# Patient Record
Sex: Female | Born: 1935 | Race: White | Hispanic: No | Marital: Married | Smoking: Never smoker
Health system: Southern US, Community
[De-identification: ages and names within clinical notes are randomized; demographics above are authoritative.]

## PROBLEM LIST (undated history)

## (undated) DIAGNOSIS — E785 Hyperlipidemia, unspecified: Secondary | ICD-10-CM

## (undated) DIAGNOSIS — E079 Disorder of thyroid, unspecified: Secondary | ICD-10-CM

## (undated) DIAGNOSIS — I10 Essential (primary) hypertension: Secondary | ICD-10-CM

## (undated) DIAGNOSIS — E119 Type 2 diabetes mellitus without complications: Secondary | ICD-10-CM

## (undated) HISTORY — DX: Disorder of thyroid, unspecified: E07.9

## (undated) HISTORY — DX: Hyperlipidemia, unspecified: E78.5

## (undated) HISTORY — DX: Type 2 diabetes mellitus without complications: E11.9

## (undated) HISTORY — DX: Essential (primary) hypertension: I10

---

## 2015-07-16 LAB — LIPID PANEL
Cholesterol: 222 mg/dL — AB (ref 0–200)
HDL: 70 mg/dL (ref 35–70)
LDL CALC: 106 mg/dL
TRIGLYCERIDES: 229 mg/dL — AB (ref 40–160)

## 2015-07-16 LAB — MICROALBUMIN, URINE: MICROALB UR: 14

## 2015-07-16 LAB — CBC AND DIFFERENTIAL
HCT: 36 % (ref 36–46)
HEMOGLOBIN: 12.2 g/dL (ref 12.0–16.0)
Platelets: 260 10*3/uL (ref 150–399)
WBC: 7.9 10*3/mL

## 2015-07-16 LAB — HEPATIC FUNCTION PANEL
ALT: 25 U/L (ref 7–35)
AST: 18 U/L (ref 13–35)
Alkaline Phosphatase: 63 U/L (ref 25–125)
BILIRUBIN, TOTAL: 0.4 mg/dL

## 2015-07-16 LAB — BASIC METABOLIC PANEL
Creatinine: 0.9 mg/dL (ref 0.5–1.1)
Potassium: 4.2 mmol/L (ref 3.4–5.3)
SODIUM: 134 mmol/L — AB (ref 137–147)

## 2015-07-16 LAB — MICROALBUMIN / CREATININE URINE RATIO: MICROALB/CREAT RATIO: 14.9

## 2015-07-16 LAB — HEMOGLOBIN A1C: HEMOGLOBIN A1C: 6.8

## 2015-07-16 LAB — TSH+FREE T4: TSH: 87.97

## 2015-07-16 LAB — TSH: TSH: 0.08 u[IU]/mL — AB (ref <=5.90)

## 2015-07-17 ENCOUNTER — Encounter: Payer: Self-pay | Admitting: Family Medicine

## 2015-07-17 ENCOUNTER — Ambulatory Visit (INDEPENDENT_AMBULATORY_CARE_PROVIDER_SITE_OTHER): Payer: Medicare Other | Admitting: Family Medicine

## 2015-07-17 VITALS — BP 113/61 | HR 96 | Ht 68.5 in | Wt 146.0 lb

## 2015-07-17 DIAGNOSIS — E039 Hypothyroidism, unspecified: Secondary | ICD-10-CM | POA: Diagnosis not present

## 2015-07-17 DIAGNOSIS — H34239 Retinal artery branch occlusion, unspecified eye: Secondary | ICD-10-CM

## 2015-07-17 DIAGNOSIS — R413 Other amnesia: Secondary | ICD-10-CM

## 2015-07-17 DIAGNOSIS — E119 Type 2 diabetes mellitus without complications: Secondary | ICD-10-CM

## 2015-07-17 DIAGNOSIS — M1990 Unspecified osteoarthritis, unspecified site: Secondary | ICD-10-CM

## 2015-07-17 DIAGNOSIS — I10 Essential (primary) hypertension: Secondary | ICD-10-CM

## 2015-07-17 DIAGNOSIS — M810 Age-related osteoporosis without current pathological fracture: Secondary | ICD-10-CM

## 2015-07-17 LAB — BASIC METABOLIC PANEL
BUN: 13 mg/dL (ref 7–25)
CO2: 27 mmol/L (ref 20–31)
Calcium: 9.1 mg/dL (ref 8.6–10.4)
Chloride: 104 mmol/L (ref 98–110)
Creat: 0.8 mg/dL (ref 0.60–0.93)
Glucose, Bld: 138 mg/dL — ABNORMAL HIGH (ref 65–99)
POTASSIUM: 4.3 mmol/L (ref 3.5–5.3)
SODIUM: 138 mmol/L (ref 135–146)

## 2015-07-17 LAB — HEMOGLOBIN A1C
HEMOGLOBIN A1C: 7.6 % — AB (ref <5.7)
MEAN PLASMA GLUCOSE: 171 mg/dL — AB (ref <117)

## 2015-07-17 LAB — TSH: TSH: 0.019 u[IU]/mL — ABNORMAL LOW (ref 0.350–4.500)

## 2015-07-17 MED ORDER — DONEPEZIL HCL 10 MG PO TABS: 10.0000 mg | ORAL_TABLET | Freq: Every day | ORAL | Status: DC

## 2015-07-17 MED ORDER — ONDANSETRON HCL 4 MG PO TABS: 4.0000 mg | ORAL_TABLET | Freq: Three times a day (TID) | ORAL | Status: DC | PRN

## 2015-07-17 MED ORDER — CELECOXIB 200 MG PO CAPS: 200.0000 mg | ORAL_CAPSULE | Freq: Two times a day (BID) | ORAL | Status: DC | PRN

## 2015-07-17 NOTE — Progress Notes (Signed)
CC: Tasha RowanMary Sheppard is a 80 y.o. female is here for Establish Care   Subjective: HPI:   very pleasant 80 year old here to establish care accompanied by daughter, Tasha OppenheimKitty   History of type 2 diabetes currently taking 10 units of Lantus on a daily basis. Blood sugars are ranging in the low to high 100s. She was once on Januvia and  A sulfonylurea however was having  Hypoglycemic episodes and these were stopped. Denies any motor or sensory disturbances in the appendages   She is a history of osteoporosis currently  fosamax which she has been on for 2 years now. She is unsure of her  Last bone density scan.   She claims osteoarthritis in the elbows and in the hands. This has been present for matter of years. She's currently using Aleve and topical over-the-counter preparations. She felt that symptoms are only mildly helped. Symptoms are interfering with her quality of life still despite taking these medications. She denies any swelling or redness of joints.   History essential hypertension currently on lisinopril. No outside blood pressures report.   She has a history of a retinal artery occlusion and [facility], she and her daughter are [facility]   Her major problem today is memory loss, this  Has been present for at least 3 years and is slowly worsening. It was once thought that this was due to uncontrolled hypothyroidism however her TSH was normal when checked back in the summer and symptoms have not improved. She does not recognize that the family members pointed out forgetfulness, short-term memory loss and once walking out of a store with unpurchased items by accident.  Review of Systems - General ROS: negative for - chills, fever, night sweats, weight gain or weight loss Ophthalmic ROS: negative for - decreased vision Psychological ROS: negative for - anxiety or depression ENT ROS: negative for - hearing change, nasal congestion,  tinnitus or allergies Hematological and Lymphatic ROS: negative for - bleeding problems, bruising or swollen lymph nodes Breast ROS: negative Respiratory ROS: no cough, shortness of breath, or wheezing Cardiovascular ROS: no chest pain or dyspnea on exertion Gastrointestinal ROS: no abdominal pain, change in bowel habits, or black or bloody stools Genito-Urinary ROS: negative for - genital discharge, genital ulcers, incontinence or abnormal bleeding from genitals Musculoskeletal ROS: negative for - joint pain or muscle painother than that described above Neurological ROS: negative for - headaches Dermatological ROS: negative for lumps, mole changes, rash and skin lesion changes  Past Medical History  Diagnosis Date  . Hypertension   . Diabetes mellitus without complication (HCC)   . Thyroid disease   . Hyperlipidemia     History reviewed. No pertinent past surgical history. Family History  Problem Relation Age of Onset  . Diabetes Mother   . Stroke Mother   . Diabetes Brother   . Stroke Brother     Social History   Social History  . Marital Status: Married    Spouse Name: N/A  . Number of Children: N/A  . Years of Education: N/A   Occupational History  . Not on file.   Social History Main Topics  . Smoking status: Never Smoker   . Smokeless tobacco: Never Used  . Alcohol Use: No  . Drug Use: No  . Sexual Activity: Not on file   Other Topics Concern  . Not on file   Social History Narrative  . No narrative on file  Objective: BP 113/61 mmHg  Pulse 96  Ht 5' 8.5" (1.74 m)  Wt 146 lb (66.225 kg)  BMI 21.87 kg/m2  Vital signs reviewed. General: Alert and Oriented, No Acute Distress HEENT: Pupils equal, round, reactive to light. Conjunctivae clear.  External ears unremarkable.  Moist mucous membranes. Lungs: Clear and comfortable work of breathing, speaking in full sentences without accessory muscle use. Cardiac: Regular rate and rhythm.  Neuro: CN II-XII  grossly intact, gait normal. Extremities: No peripheral edema.  Strong peripheral pulses.  Mental Status: No depression, anxiety, nor agitation. Logical though process. Skin: Warm and dry.  Assessment & Plan: Tasha Sheppard was seen today for establish care.  Diagnoses and all orders for this visit:  Type 2 diabetes mellitus without complication, without long-term current use of insulin (HCC) -     Hemoglobin A1c  Osteoporosis  Hypothyroidism, unspecified hypothyroidism type -     TSH  Essential hypertension, benign -     Basic Metabolic Panel (BMET)  Memory loss  Branch retinal artery occlusion, unspecified laterality -     Ambulatory referral to Ophthalmology  Osteoarthritis, unspecified osteoarthritis type, unspecified site  Other orders -     donepezil (ARICEPT) 10 MG tablet; Take 1 tablet (10 mg total) by mouth at bedtime. To help with memory. -     ondansetron (ZOFRAN) 4 MG tablet; Take 1-2 tablets (4-8 mg total) by mouth every 8 (eight) hours as needed for nausea or vomiting. -     celecoxib (CELEBREX) 200 MG capsule; Take 1 capsule (200 mg total) by mouth 2 (two) times daily as needed (pain).   Type 2 diabetes: Continue Lantus 10 units daily pending A1c Osteoporosis: Requesting outside records, continue Fosamax Hypothyroidism: Continue levothyroxine pending TSH today Essential hypertension: Controlled continue lisinopril pending renal function checked today Memory loss: Start Aricept Retinal artery occlusion, [facility] referral per request Osteoarthritis: Start celecoxib in the place of Aleve  Return in about 3 months (around 10/16/2015) for Memory and Sugar Follow Up.

## 2015-07-18 ENCOUNTER — Telehealth: Payer: Self-pay | Admitting: Family Medicine

## 2015-07-18 MED ORDER — LEVOTHYROXINE SODIUM 100 MCG PO TABS: 100.0000 ug | ORAL_TABLET | Freq: Every day | ORAL | Status: DC

## 2015-07-18 NOTE — Telephone Encounter (Signed)
Will you please let patient or family member know that her levothyroxine dose appears to be too high therefore I've sent in a new formulation of 100 mcg to her [facility].  We'll want to recheck this in three months.  Her A1c was 7.6 and at goal since it was below 8.

## 2015-07-18 NOTE — Telephone Encounter (Signed)
Pt advised.

## 2015-07-25 ENCOUNTER — Ambulatory Visit (INDEPENDENT_AMBULATORY_CARE_PROVIDER_SITE_OTHER): Payer: Medicare Other | Admitting: Family Medicine

## 2015-07-25 ENCOUNTER — Encounter: Payer: Self-pay | Admitting: Family Medicine

## 2015-07-25 VITALS — BP 102/59 | HR 92 | Temp 98.1°F | Wt 145.0 lb

## 2015-07-25 DIAGNOSIS — R110 Nausea: Secondary | ICD-10-CM

## 2015-07-25 DIAGNOSIS — R7309 Other abnormal glucose: Secondary | ICD-10-CM | POA: Diagnosis not present

## 2015-07-25 DIAGNOSIS — R739 Hyperglycemia, unspecified: Secondary | ICD-10-CM

## 2015-07-25 LAB — GLUCOSE, POCT (MANUAL RESULT ENTRY): POC GLUCOSE: 241 mg/dL — AB (ref 70–99)

## 2015-07-25 MED ORDER — METOCLOPRAMIDE HCL 10 MG PO TABS: 10.0000 mg | ORAL_TABLET | Freq: Three times a day (TID) | ORAL | Status: DC | PRN

## 2015-07-25 NOTE — Progress Notes (Signed)
CC: Hassan RowanMary Hilburn is a 80 y.o. female is here for Nausea and Blood Sugar Problem   Subjective: HPI:  Patient complains of nauseousness that happens on a daily basis that has been present for matter of years. Lately she's been handling this with zofran and drinking ginger ale during the day. Family noticed that her blood sugar was 300 today after drinking ginger ale which alarmed them to come to my office. Patient denies any abdominal pain or reflux-like symptoms. Nothing seems to make symptoms better or worse. It's caused her to lose her appetite. Family notices that if they go out to eat she seems hungry but at home she has no appetite. She denies fevers, chills, nor new back pain.denies unintentional weight loss.   Review Of Systems Outlined In HPI  Past Medical History  Diagnosis Date  . Hypertension   . Diabetes mellitus without complication (HCC)   . Thyroid disease   . Hyperlipidemia     No past surgical history on file. Family History  Problem Relation Age of Onset  . Diabetes Mother   . Stroke Mother   . Diabetes Brother   . Stroke Brother     Social History   Social History  . Marital Status: Married    Spouse Name: N/A  . Number of Children: N/A  . Years of Education: N/A   Occupational History  . Not on file.   Social History Main Topics  . Smoking status: Never Smoker   . Smokeless tobacco: Never Used  . Alcohol Use: No  . Drug Use: No  . Sexual Activity: Not on file   Other Topics Concern  . Not on file   Social History Narrative     Objective: BP 102/59 mmHg  Pulse 92  Temp(Src) 98.1 F (36.7 C) (Oral)  Wt 145 lb (65.772 kg)  General: Alert and Oriented, No Acute Distress HEENT: Pupils equal, round, reactive to light. Conjunctivae clear.  Moist mucous membranes Lungs: Clear to auscultation bilaterally, no wheezing/ronchi/rales.  Comfortable work of breathing. Good air movement. Cardiac: Regular rate and rhythm. Normal S1/S2.  No murmurs, rubs,  nor gallops.   Abdomen: Normal bowel sounds, soft and non tender without palpable masses.no guarding or rebound tenderness Extremities: No peripheral edema.  Strong peripheral pulses.  Mental Status: No depression, anxiety, nor agitation. Skin: Warm and dry.  Blood sugar today 240  Assessment & Plan: [name] was seen today for nausea and blood sugar problem.  Diagnoses and all orders for this visit:  Nausea without vomiting -     Lipase -     metoCLOPramide (REGLAN) 10 MG tablet; Take 1 tablet (10 mg total) by mouth every 8 (eight) hours as needed for nausea.  Elevated blood sugar   Nausea: Rule out mild pancreatic Titus with lipase today, she can start Reglan instead of taking Zofran. If her appetite continues to be a problem family has inquired whether or not she could get Marinol which seems reasonable. Signs and symptoms requring emergent/urgent reevaluation were discussed with the patient. LAD the LAD 25 minutes spent face-to-face during visit today of which at least 50% was counseling or coordinating care regarding: 1. Nausea without vomiting   2. Elevated blood sugar        Return in about 3 months (around 10/23/2015).

## 2015-07-26 ENCOUNTER — Telehealth: Payer: Self-pay | Admitting: Family Medicine

## 2015-07-26 DIAGNOSIS — R110 Nausea: Secondary | ICD-10-CM

## 2015-07-26 DIAGNOSIS — R748 Abnormal levels of other serum enzymes: Secondary | ICD-10-CM

## 2015-07-26 LAB — LIPASE: LIPASE: 153 U/L — AB (ref 7–60)

## 2015-07-26 NOTE — Telephone Encounter (Signed)
Husband notified  

## 2015-07-26 NOTE — Telephone Encounter (Signed)
Will you please let patient's family member (daughter or husband) know that Tasha Sheppard's blood test showed signs of mild liver inflammation that could be the cause of her nausea.  I'd recommend having an ultrasound of her abdomen performed to look for the ultimate cause of this.  Our radiology office should be calling her today or tomorrow, please let me know if not contacted by Monday.

## 2015-07-27 ENCOUNTER — Other Ambulatory Visit: Payer: Self-pay

## 2015-07-27 ENCOUNTER — Ambulatory Visit (INDEPENDENT_AMBULATORY_CARE_PROVIDER_SITE_OTHER): Payer: Medicare Other

## 2015-07-27 DIAGNOSIS — R748 Abnormal levels of other serum enzymes: Secondary | ICD-10-CM | POA: Diagnosis not present

## 2015-07-27 DIAGNOSIS — R110 Nausea: Secondary | ICD-10-CM

## 2015-07-27 MED ORDER — CITALOPRAM HYDROBROMIDE 20 MG PO TABS: 20.0000 mg | ORAL_TABLET | Freq: Every day | ORAL | Status: DC

## 2015-07-30 ENCOUNTER — Ambulatory Visit (HOSPITAL_BASED_OUTPATIENT_CLINIC_OR_DEPARTMENT_OTHER)
Admission: RE | Admit: 2015-07-30 | Discharge: 2015-07-30 | Disposition: A | Discharge status: Home / Self Care | Payer: Medicare Other | Source: Ambulatory Visit | Attending: Family Medicine | Admitting: Family Medicine

## 2015-07-30 ENCOUNTER — Telehealth: Payer: Self-pay | Admitting: Family Medicine

## 2015-07-30 DIAGNOSIS — I708 Atherosclerosis of other arteries: Secondary | ICD-10-CM | POA: Insufficient documentation

## 2015-07-30 DIAGNOSIS — R110 Nausea: Secondary | ICD-10-CM | POA: Diagnosis not present

## 2015-07-30 DIAGNOSIS — R748 Abnormal levels of other serum enzymes: Secondary | ICD-10-CM

## 2015-07-30 DIAGNOSIS — I700 Atherosclerosis of aorta: Secondary | ICD-10-CM | POA: Insufficient documentation

## 2015-07-30 DIAGNOSIS — R531 Weakness: Secondary | ICD-10-CM | POA: Insufficient documentation

## 2015-07-30 MED ORDER — IOHEXOL 300 MG/ML  SOLN
100.0000 mL | Freq: Once | INTRAMUSCULAR | Status: AC | PRN
  Administered 2015-07-30: 100 mL via INTRAVENOUS

## 2015-07-30 NOTE — Telephone Encounter (Signed)
Husband notified  

## 2015-07-30 NOTE — Telephone Encounter (Signed)
Will you please let patient know that her ultrasound was normal but was not able to fully visualize her pancreas.  I'd recommend having a CT scan done to fully look at her pancreas.  An order has been placed, please let me know if not not contacted about scheduling by the end of the week.

## 2015-07-31 ENCOUNTER — Telehealth: Payer: Self-pay | Admitting: Family Medicine

## 2015-07-31 DIAGNOSIS — R748 Abnormal levels of other serum enzymes: Secondary | ICD-10-CM

## 2015-07-31 NOTE — Telephone Encounter (Signed)
In regards to CT result note I'd recommend repeating the lipase level to see if it's staying the same, worsening, or improving.  Lab slip in your in box.

## 2015-07-31 NOTE — Telephone Encounter (Signed)
Husband advised. 

## 2015-08-01 ENCOUNTER — Encounter: Payer: Self-pay | Admitting: *Deleted

## 2015-08-01 NOTE — Progress Notes (Unsigned)
   Subjective:    Patient ID: Tasha Sheppard, female    DOB: 04/06/1935, 80 y.o.   MRN: [id number]  HPI  Patient comes in with husband, complaints of nausea and no appetite  Review of Systems     Objective:   Physical Exam Patient shows no signs of distress       Assessment & Plan:  Performed vitals and spoke with patient about symptoms.  Hasn't had an appetite in a while and is nauseous but without vomiting.  Her husband is very concerned that she isn't eating or drinking anything but diet ginger ale.  I informed Dr. Ivan AnchorsHommel and we are waiting for some blood work to come back and then proceed with a plan for this patient.

## 2015-08-02 ENCOUNTER — Telehealth: Payer: Self-pay | Admitting: Gastroenterology

## 2015-08-02 ENCOUNTER — Telehealth: Payer: Self-pay | Admitting: Family Medicine

## 2015-08-02 DIAGNOSIS — R110 Nausea: Secondary | ICD-10-CM

## 2015-08-02 DIAGNOSIS — R748 Abnormal levels of other serum enzymes: Secondary | ICD-10-CM

## 2015-08-02 LAB — LIPASE: Lipase: 49 U/L (ref 7–60)

## 2015-08-02 MED ORDER — MIRTAZAPINE 15 MG PO TABS: 15.0000 mg | ORAL_TABLET | Freq: Every day | ORAL | Status: DC

## 2015-08-02 NOTE — Telephone Encounter (Signed)
Husband notified.  The GI office contacted the pt before I could call them back and they're offering Tasha Sheppard an appointment in early Feb.

## 2015-08-02 NOTE — Telephone Encounter (Signed)
Daughter notified 

## 2015-08-02 NOTE — Telephone Encounter (Signed)
Since that's so far off I'll send in a Rx for mirtazapine in hopes that this stimulates her appetite.

## 2015-08-02 NOTE — Telephone Encounter (Signed)
Ok to schedule appt in Feb and can try to bring in sooner in case of any cancellations or availability on the schedule

## 2015-08-02 NOTE — Telephone Encounter (Signed)
Will you please let patient's family know that her pancreas test is now back in the normal range.  Since she's still having a lack of appetite and nausea I'll refer her to a gastroenterologist for further workup since her CT and Ultrasound were normal.

## 2015-08-02 NOTE — Telephone Encounter (Signed)
Normal Lipase, abdominal u/s and CT of the abdomen. Her PCP is aware of the first available appointment being in February. No PA openings. PCP prescribed Remeron to stimulate her appetite. Please advise as Doc of the Day on the appointment.

## 2015-08-02 NOTE — Telephone Encounter (Signed)
Wife is still not feeling well.

## 2015-08-02 NOTE — Telephone Encounter (Signed)
See Dr Elana AlmNandigam's note.

## 2015-08-02 NOTE — Telephone Encounter (Signed)
Yes this is why I'm referring her to a gastroenterologist.

## 2015-08-02 NOTE — Telephone Encounter (Signed)
Called and spoke w/Patient.  Patient stated she would contact her PCP

## 2015-08-08 ENCOUNTER — Encounter: Payer: Self-pay | Admitting: Family Medicine

## 2015-08-08 ENCOUNTER — Ambulatory Visit (INDEPENDENT_AMBULATORY_CARE_PROVIDER_SITE_OTHER): Payer: Medicare Other | Admitting: Family Medicine

## 2015-08-08 ENCOUNTER — Ambulatory Visit (INDEPENDENT_AMBULATORY_CARE_PROVIDER_SITE_OTHER): Payer: Medicare Other

## 2015-08-08 VITALS — BP 119/63 | HR 88 | Temp 97.9°F | Resp 18 | Wt 145.4 lb

## 2015-08-08 DIAGNOSIS — J189 Pneumonia, unspecified organism: Secondary | ICD-10-CM

## 2015-08-08 DIAGNOSIS — M542 Cervicalgia: Secondary | ICD-10-CM

## 2015-08-08 DIAGNOSIS — M4802 Spinal stenosis, cervical region: Secondary | ICD-10-CM

## 2015-08-08 NOTE — Progress Notes (Signed)
CC: Tasha Sheppard is a 80 y.o. female is here for Hospitalization Follow-up   Subjective: HPI:  Earlier this week she had a episode where she passed out at the dinner table. She regained consciousness within 15 seconds. EMS took her to a local hospital and she was found to have a right upper lobe pneumonia along with signs of orthostatic hypotension. She was given Rocephin and observed overnight. During that time she also had a CT scan of the face, MRI of the brain, carotid ultrasound and echocardiogram. There were no abnormalities seen that would suggest a reason for her passing out. It was felt that this was likely due to hypovolemia in the setting of pneumonia. She was having an occasional cough but this is resolved. She is tolerating Levaquin and has 5 more days left of a seven-day course. She denies fevers, chills, decreased appetite or abdominal pain.  Ever since stopping Aricept she's noticed that her nausea has returned to its baseline. 2 days after starting Aricept she was getting nauseousthat would not respond to Zofran or Reglan.  She is also worried about chronic neck pain has been present for matter of years. She cannot remember what interventions have been done as yet but she does not recall ever having a x-ray of the neck. She states is midline neck pain on a daily basis that is not radiating. Nothing seems to make it better or worse. It is mild in severity.   Review Of Systems Outlined In HPI  Past Medical History  Diagnosis Date  . Hypertension   . Diabetes mellitus without complication (HCC)   . Thyroid disease   . Hyperlipidemia     No past surgical history on file. Family History  Problem Relation Age of Onset  . Diabetes Mother   . Stroke Mother   . Diabetes Brother   . Stroke Brother     Social History   Social History  . Marital Status: Married    Spouse Name: N/A  . Number of Children: N/A  . Years of Education: N/A   Occupational History  . Not on file.    Social History Main Topics  . Smoking status: Never Smoker   . Smokeless tobacco: Never Used  . Alcohol Use: No  . Drug Use: No  . Sexual Activity: Not on file   Other Topics Concern  . Not on file   Social History Narrative     Objective: BP 119/63 mmHg  Pulse 88  Temp(Src) 97.9 F (36.6 C)  Resp 18  Wt 145 lb 6.4 oz (65.953 kg)  SpO2 97%  General: Alert and Oriented, No Acute Distress HEENT: Pupils equal, round, reactive to light. Conjunctivae clear.  Moist mucous membranes Lungs: Clear to auscultation bilaterally, no wheezing/ronchi/rales.  Comfortable work of breathing. Good air movement. Cardiac: Regular rate and rhythm. Normal S1/S2.  No murmurs, rubs, nor gallops.   Back: No midline spinous process tenderness in the cervical spine. Cervical exam shows Full range of motion and strength in all 3 planes of motion. Extremities: No peripheral edema.  Strong peripheral pulses.  Mental Status: No depression, anxiety, nor agitation. Skin: Warm and dry.  Assessment & Plan: Tahni was seen today for hospitalization follow-up.  Diagnoses and all orders for this visit:  CAP (community acquired pneumonia) -     DG Chest 2 View; Future  Posterior neck pain -     DG Cervical Spine Complete; Future   Community acquired pneumonia: Agree with Levaquin regimen, continue the  final 5 days. Obtain x-ray to see if resolution has begun. Posterior neck pain: Obtain spine films for further evaluation. Stopping Aricept due to possibility that this is causing worsened nausea.  25 minutes spent face-to-face during visit today of which at least 50% was counseling or coordinating care regarding: 1. CAP (community acquired pneumonia)   2. Posterior neck pain       Return if symptoms worsen or fail to improve.

## 2015-08-09 ENCOUNTER — Telehealth: Payer: Self-pay | Admitting: Family Medicine

## 2015-08-09 DIAGNOSIS — M5030 Other cervical disc degeneration, unspecified cervical region: Secondary | ICD-10-CM | POA: Insufficient documentation

## 2015-08-09 MED ORDER — MELOXICAM 15 MG PO TABS: 15.0000 mg | ORAL_TABLET | Freq: Every day | ORAL | Status: DC

## 2015-08-09 NOTE — Telephone Encounter (Signed)
Will you please let patient or husband know that her pneumonia is improving based on her chest xray.  Also her neck has a moderate degree of arthritis and I'd recommend trying an anti-inflammatory called meloxicam, i'll send this to her pharmacy.  If this does not help pain please call and let me know so I can refer her to one of our sports medicine doctors for further evaluation.

## 2015-08-09 NOTE — Telephone Encounter (Signed)
Awaiting call back.

## 2015-08-09 NOTE — Telephone Encounter (Signed)
Daughter advised.

## 2015-08-22 ENCOUNTER — Ambulatory Visit (INDEPENDENT_AMBULATORY_CARE_PROVIDER_SITE_OTHER): Payer: Medicare Other | Admitting: Family Medicine

## 2015-08-22 ENCOUNTER — Encounter: Payer: Self-pay | Admitting: Family Medicine

## 2015-08-22 ENCOUNTER — Ambulatory Visit (INDEPENDENT_AMBULATORY_CARE_PROVIDER_SITE_OTHER): Payer: Medicare Other

## 2015-08-22 VITALS — BP 121/69 | HR 109 | Wt 141.0 lb

## 2015-08-22 DIAGNOSIS — Z1231 Encounter for screening mammogram for malignant neoplasm of breast: Secondary | ICD-10-CM

## 2015-08-22 DIAGNOSIS — M25511 Pain in right shoulder: Secondary | ICD-10-CM

## 2015-08-22 DIAGNOSIS — M810 Age-related osteoporosis without current pathological fracture: Secondary | ICD-10-CM | POA: Diagnosis not present

## 2015-08-22 DIAGNOSIS — Z1239 Encounter for other screening for malignant neoplasm of breast: Secondary | ICD-10-CM

## 2015-08-22 DIAGNOSIS — F0390 Unspecified dementia without behavioral disturbance: Secondary | ICD-10-CM | POA: Diagnosis not present

## 2015-08-22 MED ORDER — MEMANTINE HCL 5 MG PO TABS: 5.0000 mg | ORAL_TABLET | Freq: Two times a day (BID) | ORAL | Status: DC

## 2015-08-22 MED ORDER — ALENDRONATE SODIUM 70 MG PO TABS: 70.0000 mg | ORAL_TABLET | ORAL | Status: DC

## 2015-08-22 NOTE — Progress Notes (Signed)
CC: Tasha Sheppard is a 81 y.o. female is here for Fall   Subjective: HPI:  Accompanied by son in law, Tasha Sheppard, and daughter, Tasha Sheppard  Tuesday of last week she had an unwitnessed fall at [employer]. She was found to have  A collection of mildly displaced  Facial fractures that was evaluated by [facility] thankfully feels that conservative therapy is warranted. Patient does not recall the fall other than being on the ground when the EMS and her husband came. She tells me her face feels fine but her shoulder is bothering her. This pain began 2 days after the fall. It was absent yesterday when she was resting the arm the entire day. It's worse with any movement out in front of her or putting on a shirt. Pain is localized on the top of the shoulder and nonradiating. She denies any extremity weakness.  Patient is not certain whether or not she felt lightheaded before the fall, her best recollection she does not believe that she had any balance problem that contributed to this. She is confident that she did not trip over anything.  She denies shortness of breath, chest pain, lightheadedness, dizziness, nor headache. The family wants to know if we can cut back on some of her medications. The daughter and son-in-law have noticed that patient's husband will occasionally use leftover medication to play doctor with his wife, he does not intentionally mean any harm.     Review Of Systems Outlined In HPI  Past Medical History  Diagnosis Date  . Hypertension   . Diabetes mellitus without complication (HCC)   . Thyroid disease   . Hyperlipidemia     No past surgical history on file. Family History  Problem Relation Age of Onset  . Diabetes Mother   . Stroke Mother   . Diabetes Brother   . Stroke Brother     Social History   Social History  . Marital Status: Married    Spouse Name: N/A  . Number of Children: N/A  . Years of Education: N/A   Occupational History  . Not on file.   Social History Main  Topics  . Smoking status: Never Smoker   . Smokeless tobacco: Never Used  . Alcohol Use: No  . Drug Use: No  . Sexual Activity: Not on file   Other Topics Concern  . Not on file   Social History Narrative     Objective: BP 121/69 mmHg  Pulse 109  Wt 141 lb (63.957 kg)  General: Alert and Oriented, No Acute Distress HEENT: Pupils equal, round, reactive to light. Conjunctivae clear. Moist mucous membranes Lungs: Clear to auscultation bilaterally, no wheezing/ronchi/rales.  Comfortable work of breathing. Good air movement. Cardiac: Regular rate and rhythm. Normal S1/S2.  No murmurs, rubs, nor gallops.   Right shoulder exam reveals full range of motion and strength in all planes of motion and with individual rotator cuff testing. No overlying redness warmth or swelling.  Neer's test negative.  Hawkins test negative. Empty can negative. Crossarm test negative. O'Brien's test negative. Apprehension test negative. Speed's test negative. Extremities: No peripheral edema.  Strong peripheral pulses.  Mental Status: No depression, anxiety, nor agitation. Skin: Warm and dry.  Assessment & Plan: Tasha Sheppard was seen today for fall.  Diagnoses and all orders for this visit:  Osteoporosis -     alendronate (FOSAMAX) 70 MG tablet; Take 1 tablet (70 mg total) by mouth once a week. Take with a full glass of water on an empty  stomach.  Dementia, without behavioral disturbance -     memantine (NAMENDA) 5 MG tablet; Take 1 tablet (5 mg total) by mouth 2 (two) times daily.  Screening breast examination -     MM DIGITAL SCREENING BILATERAL; Future  Right shoulder pain   Essential hypertension: Lisinopril was discontinued today due to concerns that this could be contributing to her falls. Additionally citalopram was discontinued today as the patient and family member did not recall any episodes of anxiety or depression. This possible this was added in hopes of helping with her dementia. I like her to  start on Namenda twice a day to help with memory. She is due for a mammogram which was ordered today Her right shoulder pain is unlikely due to a rotator cuff strain or any bone abnormality therefore I placed her in a sling today which immediately relieved her pain. This should be worn for the next 1-2 weeks as tolerated as needed.  40 minutes spent face-to-face during visit today of which at least 50% was counseling or coordinating care regarding: 1. Osteoporosis   2. Dementia, without behavioral disturbance   3. Screening breast examination   4. Right shoulder pain       Return in about 4 weeks (around 09/19/2015).

## 2015-09-05 ENCOUNTER — Other Ambulatory Visit: Payer: Self-pay | Admitting: Family Medicine

## 2015-09-06 NOTE — Telephone Encounter (Signed)
Did you want patient to continue taking medication?

## 2015-09-06 NOTE — Telephone Encounter (Signed)
Yes, refills sent in.

## 2015-09-11 ENCOUNTER — Encounter: Payer: Self-pay | Admitting: Family Medicine

## 2015-09-19 ENCOUNTER — Encounter: Payer: Self-pay | Admitting: Family Medicine

## 2015-09-19 ENCOUNTER — Ambulatory Visit (INDEPENDENT_AMBULATORY_CARE_PROVIDER_SITE_OTHER): Payer: Medicare Other | Admitting: Family Medicine

## 2015-09-19 VITALS — BP 120/71 | HR 102 | Wt 144.0 lb

## 2015-09-19 DIAGNOSIS — I10 Essential (primary) hypertension: Secondary | ICD-10-CM | POA: Diagnosis not present

## 2015-09-19 DIAGNOSIS — E119 Type 2 diabetes mellitus without complications: Secondary | ICD-10-CM | POA: Diagnosis not present

## 2015-09-19 MED ORDER — METFORMIN HCL 1000 MG PO TABS: ORAL_TABLET | ORAL | Status: DC

## 2015-09-19 NOTE — Progress Notes (Signed)
CC: Tasha Sheppard is a 81 y.o. female is here for Dementia   Subjective: HPI:  Follow-up hypertension: Since stopping lisinopril she's not been checking her blood pressure at home but denies any new symptoms. She denies any lightheadedness dizziness or falls. She tells me she is no longer lightheaded like she was when she was taking lisinopril.  Follow-up type 2 diabetes: Husband states that blood sugar has been ranging between 501-175. On further questioning he is using a glucometer with strips that are well expired. She's taking 15 units of Lantus on a nightly basis. I asked her whether or not she remembersbeing on metformin and she and her husband believe that she was on this at some point but cannot remember why it was stopped. No polyuria I visual polydipsia   Review Of Systems Outlined In HPI  Past Medical History  Diagnosis Date  . Hypertension   . Diabetes mellitus without complication (HCC)   . Thyroid disease   . Hyperlipidemia     No past surgical history on file. Family History  Problem Relation Age of Onset  . Diabetes Mother   . Stroke Mother   . Diabetes Brother   . Stroke Brother     Social History   Social History  . Marital Status: Married    Spouse Name: N/A  . Number of Children: N/A  . Years of Education: N/A   Occupational History  . Not on file.   Social History Main Topics  . Smoking status: Never Smoker   . Smokeless tobacco: Never Used  . Alcohol Use: No  . Drug Use: No  . Sexual Activity: Not on file   Other Topics Concern  . Not on file   Social History Narrative     Objective: BP 120/71 mmHg  Pulse 102  Wt 144 lb (65.318 kg)  Vital signs reviewed. General: Alert and Oriented, No Acute Distress HEENT: Pupils equal, round, reactive to light. Conjunctivae clear.  External ears unremarkable.  Moist mucous membranes. Lungs: Clear and comfortable work of breathing, speaking in full sentences without accessory muscle use. Cardiac:  Regular rate and rhythm.  Neuro: CN II-XII grossly intact, gait normal. Extremities: No peripheral edema.  Strong peripheral pulses.  Mental Status: No depression, anxiety, nor agitation. Logical though process. Skin: Warm and dry.  Assessment & Plan: Tasha Sheppard was seen today for dementia.  Diagnoses and all orders for this visit:  Type 2 diabetes mellitus without complication, without long-term current use of insulin (HCC) -     metFORMIN (GLUCOPHAGE) 1000 MG tablet; One tablet by mouth every evening for blood sugar control.  Essential hypertension, benign  type 2 diabetes: Uncontrolled, encouraged to get test strips that are fresh and not expired, the numbers that they are getting could be quite off. Start metformin. Essential hypertension: Controlled without any antihypertensives.   Return in about 4 weeks (around 10/17/2015).

## 2015-09-21 ENCOUNTER — Telehealth: Payer: Self-pay

## 2015-09-21 NOTE — Telephone Encounter (Signed)
Daughter is questing why her mom is back on metformin.  She stated that she knows it's not to be taken with a long term insulin. Please advise.

## 2015-09-24 NOTE — Telephone Encounter (Signed)
Daughter notified 

## 2015-09-24 NOTE — Telephone Encounter (Signed)
Left message to call office

## 2015-09-24 NOTE — Telephone Encounter (Signed)
She's on metformin to help better control her blood sugar since insulin alone was not controlling her swings in blood sugar.  I'd like to reassure her that metformin is FDA approved to be taken with insulin and most patients with type 2 diabetes take this medication even if they are on insulin.

## 2015-10-23 ENCOUNTER — Encounter: Payer: Self-pay | Admitting: Family Medicine

## 2015-10-23 ENCOUNTER — Ambulatory Visit (INDEPENDENT_AMBULATORY_CARE_PROVIDER_SITE_OTHER): Payer: Medicare Other | Admitting: Family Medicine

## 2015-10-23 VITALS — BP 117/73 | HR 106 | Wt 148.0 lb

## 2015-10-23 DIAGNOSIS — M5030 Other cervical disc degeneration, unspecified cervical region: Secondary | ICD-10-CM

## 2015-10-23 DIAGNOSIS — I10 Essential (primary) hypertension: Secondary | ICD-10-CM | POA: Diagnosis not present

## 2015-10-23 DIAGNOSIS — E119 Type 2 diabetes mellitus without complications: Secondary | ICD-10-CM

## 2015-10-23 DIAGNOSIS — Z794 Long term (current) use of insulin: Secondary | ICD-10-CM | POA: Diagnosis not present

## 2015-10-23 LAB — POCT GLYCOSYLATED HEMOGLOBIN (HGB A1C): Hemoglobin A1C: 9.1

## 2015-10-23 MED ORDER — METFORMIN HCL 1000 MG PO TABS: ORAL_TABLET | ORAL | Status: DC

## 2015-10-23 NOTE — Progress Notes (Signed)
CC: Tasha RowanMary Sheppard is a 81 y.o. female is here for Hyperglycemia   Subjective: HPI:  Follow-up visit for hypertension: currently not taking anything for blood pressure. Since I saw her last she has not had any falls or lightheadedness. She denies chest pain shortness breath peripheral edema but does have some posterior neck pain that's been present for matter of years. The pain is nonradiating and nothing particularly makes it better or worse. No benefit from Celebrex or meloxicam. Denies radiation of pain. Denies trauma.  Follow-up type 2 diabetes: She only took metformin for a couple weeks and then lost the prescription. She was tolerating this without any outside blood sugars to report. She admits that she has trouble with portion control when it comes to cookies, cakes, sugary substances. Denies polyuria pipe visual polydipsia   Review Of Systems Outlined In HPI  Past Medical History  Diagnosis Date  . Hypertension   . Diabetes mellitus without complication (HCC)   . Thyroid disease   . Hyperlipidemia     No past surgical history on file. Family History  Problem Relation Age of Onset  . Diabetes Mother   . Stroke Mother   . Diabetes Brother   . Stroke Brother     Social History   Social History  . Marital Status: Married    Spouse Name: N/A  . Number of Children: N/A  . Years of Education: N/A   Occupational History  . Not on file.   Social History Main Topics  . Smoking status: Never Smoker   . Smokeless tobacco: Never Used  . Alcohol Use: No  . Drug Use: No  . Sexual Activity: Not on file   Other Topics Concern  . Not on file   Social History Narrative     Objective: BP 117/73 mmHg  Pulse 106  Wt 148 lb (67.132 kg)  Vital signs reviewed. General: Alert and Oriented, No Acute Distress HEENT: Pupils equal, round, reactive to light. Conjunctivae clear.  External ears unremarkable.  Moist mucous membranes. Lungs: Clear and comfortable work of breathing,  speaking in full sentences without accessory muscle use. Cardiac: Regular rate and rhythm.  Neuro: CN II-XII grossly intact, gait normal. Extremities: No peripheral edema.  Strong peripheral pulses.  Mental Status: No depression, anxiety, nor agitation. Logical though process. Skin: Warm and dry.  Assessment & Plan: Tasha Sheppard was seen today for hyperglycemia.  Diagnoses and all orders for this visit:  Essential hypertension, benign  Degeneration of cervical intervertebral disc  Type 2 diabetes mellitus without complication, with long-term current use of insulin (HCC) -     POCT HgB A1C  Type 2 diabetes mellitus without complication, without long-term current use of insulin (HCC) -     metFORMIN (GLUCOPHAGE) 1000 MG tablet; One tablet by mouth every evening for blood sugar control. -     POCT HgB A1C   Essential hypertension: Controlled without antihypertensive Cervical degenerative disc disease: Referral to Dr. Karie Schwalbe to help determine if she is a candidate forinjectable interventions. Type 2 diabetes: A1c is uncontrolled today, restart metformin and try to focus on reducing portions when it comes to desert, cake, cookies  Return in about 3 months (around 01/23/2016) for Tasha Sheppard Diabetes Check. Dr. Karie Schwalbe visit for neck pain within the next month..Marland Kitchen

## 2015-10-25 ENCOUNTER — Encounter: Payer: Self-pay | Admitting: Family Medicine

## 2015-10-25 ENCOUNTER — Ambulatory Visit (INDEPENDENT_AMBULATORY_CARE_PROVIDER_SITE_OTHER): Payer: Medicare Other | Admitting: Family Medicine

## 2015-10-25 VITALS — BP 141/75 | HR 107 | Wt 148.0 lb

## 2015-10-25 DIAGNOSIS — M5030 Other cervical disc degeneration, unspecified cervical region: Secondary | ICD-10-CM | POA: Diagnosis not present

## 2015-10-25 NOTE — Patient Instructions (Signed)
Thank you for coming in today. Go to physical therapy.  Use a heating pad.  Use over the counter tylenol arthritis up to 3 x daily.   TENS UNIT: This is helpful for muscle pain and spasm.   Search and Purchase a TENS 7000 2nd edition at www.tenspros.com. It should be less than $30.     TENS unit instructions: Do not shower or bathe with the unit on Turn the unit off before removing electrodes or batteries If the electrodes lose stickiness add a drop of water to the electrodes after they are disconnected from the unit and place on plastic sheet. If you continued to have difficulty, call the TENS unit company to purchase more electrodes. Do not apply lotion on the skin area prior to use. Make sure the skin is clean and dry as this will help prolong the life of the electrodes. After use, always check skin for unusual red areas, rash or other skin difficulties. If there are any skin problems, does not apply electrodes to the same area. Never remove the electrodes from the unit by pulling the wires. Do not use the TENS unit or electrodes other than as directed. Do not change electrode placement without consultating your therapist or physician. Keep 2 fingers with between each electrode. Wear time ratio is 2:1, on to off times.    For example on for 30 minutes off for 15 minutes and then on for 30 minutes off for 15 minutes   Return in 4 weeks.   Degenerative Disk Disease Degenerative disk disease is a condition caused by the changes that occur in spinal disks as you grow older. Spinal disks are soft and compressible disks located between the bones of your spine (vertebrae). These disks act like shock absorbers. Degenerative disk disease can affect the whole spine. However, the neck and lower back are most commonly affected. Many changes can occur in the spinal disks with aging, such as:  The spinal disks may dry and shrink.  Small tears may occur in the tough, outer covering of the disk  (annulus).  The disk space may become smaller due to loss of water.  Abnormal growths in the bone (spurs) may occur. This can put pressure on the nerve roots exiting the spinal canal, causing pain.  The spinal canal may become narrowed. RISK FACTORS   Being overweight.  Having a family history of degenerative disk disease.  Smoking.  There is increased risk if you are doing heavy lifting or have a sudden injury. SIGNS AND SYMPTOMS  Symptoms vary from person to person and may include:  Pain that varies in intensity. Some people have no pain, while others have severe pain. The location of the pain depends on the part of your backbone that is affected.  You will have neck or arm pain if a disk in the neck area is affected.  You will have pain in your back, buttocks, or legs if a disk in the lower back is affected.  Pain that becomes worse while bending, reaching up, or with twisting movements.  Pain that may start gradually and then get worse as time passes. It may also start after a major or minor injury.  Numbness or tingling in the arms or legs. DIAGNOSIS  Your health care provider will ask you about your symptoms and about activities or habits that may cause the pain. He or she may also ask about any injuries, diseases, or treatments you have had. Your health care provider will examine  you to check for the range of movement that is possible in the affected area, to check for strength in your extremities, and to check for sensation in the areas of the arms and legs supplied by different nerve roots. You may also have:   An X-ray of the spine.  Other imaging tests, such as MRI. TREATMENT  Your health care provider will advise you on the best plan for treatment. Treatment may include:  Medicines.  Rehabilitation exercises. HOME CARE INSTRUCTIONS   Follow proper lifting and walking techniques as advised by your health care provider.  Maintain good posture.  Exercise  regularly as advised by your health care provider.  Perform relaxation exercises.  Change your sitting, standing, and sleeping habits as advised by your health care provider.  Change positions frequently.  Lose weight or maintain a healthy weight as advised by your health care provider.  Do not use any tobacco products, including cigarettes, chewing tobacco, or electronic cigarettes. If you need help quitting, ask your health care provider.  Wear supportive footwear.  Take medicines only as directed by your health care provider. SEEK MEDICAL CARE IF:   Your pain does not go away within 1-4 weeks.  You have significant appetite or weight loss. SEEK IMMEDIATE MEDICAL CARE IF:   Your pain is severe.  You notice weakness in your arms, hands, or legs.  You begin to lose control of your bladder or bowel movements.  You have fevers or night sweats. MAKE SURE YOU:   Understand these instructions.  Will watch your condition.  Will get help right away if you are not doing well or get worse.   This information is not intended to replace advice given to you by your health care provider. Make sure you discuss any questions you have with your health care provider.   Document Released: 06/01/2007 Document Revised: 08/25/2014 Document Reviewed: 12/06/2013 Elsevier Interactive Patient Education [employer]2016 [employer]

## 2015-10-25 NOTE — Progress Notes (Signed)
Tasha Sheppard is a 81 y.o. female who [facility] today for neck pain.  Patient has had years of central neck pain. She's never had much of an evaluation until recently. She had an x-ray in December 2016 that showed multilevel moderate DDD. She denies any radiating pain weakness or numbness. She uses a heating pad occasionally. She takes Tylenol occasionally. No fevers chills nausea vomiting or diarrhea. She feels well otherwise.   Past Medical History  Diagnosis Date  . Hypertension   . Diabetes mellitus without complication (HCC)   . Thyroid disease   . Hyperlipidemia    No past surgical history on file. Social History  Substance Use Topics  . Smoking status: Never Smoker   . Smokeless tobacco: Never Used  . Alcohol Use: No   family history includes Diabetes in her brother and mother; Stroke in her brother and mother.  ROS:  No headache, visual changes, nausea, vomiting, diarrhea, constipation, dizziness, abdominal pain, skin rash, fevers, chills, night sweats, weight loss, swollen lymph nodes, body aches, joint swelling, muscle aches, chest pain, shortness of breath, mood changes, visual or auditory hallucinations.    Medications: Current Outpatient Prescriptions  Medication Sig Dispense Refill  . alendronate (FOSAMAX) 70 MG tablet Take 1 tablet (70 mg total) by mouth once a week. Take with a full glass of water on an empty stomach. 12 tablet 2  . aspirin EC 81 MG tablet Take 1 tablet (81 mg total) by mouth daily.    . Insulin Glargine (LANTUS SOLOSTAR) 100 UNIT/ML Solostar Pen Inject 10 Units into the skin daily at 10 pm. 5 pen PRN  . levothyroxine (SYNTHROID, LEVOTHROID) 100 MCG tablet Take 1 tablet (100 mcg total) by mouth daily. 90 tablet 1  . memantine (NAMENDA) 5 MG tablet Take 1 tablet (5 mg total) by mouth 2 (two) times daily. 60 tablet 2  . metFORMIN (GLUCOPHAGE) 1000 MG tablet One tablet by mouth every evening for  blood sugar control. 30 tablet 2  . rosuvastatin (CRESTOR) 20 MG tablet Take 20 mg by mouth daily.     No current facility-administered medications for this visit.   Allergies  Allergen Reactions  . Aricept [Donepezil Hcl]     Possibly causing worsened nausea  . Influenza Vaccines     "bad reaction"  . Pioglitazone     confusion  . Trazodone And Nefazodone     Confusion, anxiety     Exam:  BP 141/75 mmHg  Pulse 107  Wt 148 lb (67.132 kg) General: Well Developed, well nourished, and in no acute distress.  Neuro/Psych: Alert and oriented x3, extra-ocular muscles intact, able to move all 4 extremities, sensation grossly intact. Skin: Warm and dry, no rashes noted.  Respiratory: Not using accessory muscles, speaking in full sentences, trachea midline.  Cardiovascular: Pulses palpable, no extremity edema. Abdomen: Does not appear distended. MSK: Neck is nontender to spinal midline. Tender palpation bilateral cervical paraspinals especially in the C5-C6 region. Normal neck range of motion Upper extremity strength is equal and normal throughout. Upper extremities have normal reflexes and equal bilaterally. Sensation is intact throughout.  Xray Cspine dated 08/08/15  EXAM: CERVICAL SPINE - COMPLETE 4+ VIEW  COMPARISON: None in PACs  FINDINGS: There is reversal of the normal cervical lordosis that is centered at C4-5. Here there is minimal grade 1 anterolisthesis of approximately 2 mm. There is disc space narrowing at C5-6 with milder narrowing at C4-5. There is no perched facet.  The spinous processes are intact. The oblique views reveal mild bony encroachment upon the neural foramen on the right likely affecting the T6 nerve root. The odontoid is intact. The prevertebral soft tissue spaces are normal. There is dense calcification in the carotid bulbs bilaterally.  IMPRESSION: Moderate osteoarthritic disc space narrowing at C5-6 with encroachment upon the right neural  foramen. There is mild disc space narrowing at C4-5 with grade 1 anterolisthesis as well as acute angulation with loss of the normal cervical lordosis.  Cervical spine MRI is recommended unless there is a history of trauma it neck trauma has occurred, CT scanning prior to MRI would be useful.   Electronically Signed  By: David [place]aziland M.D.  On: 08/08/2015 14:20   No results found for this or any previous visit (from the past 24 hour(s)). No results found.   Please see individual assessment and plan sections.

## 2015-10-25 NOTE — Assessment & Plan Note (Signed)
Multilevel cervical DDD. This is likely the cause of pain. Treat with physical therapy TENS unit Tylenol arthritis. Recheck in 4 weeks. If not better with consider MRI.

## 2015-10-31 ENCOUNTER — Ambulatory Visit (INDEPENDENT_AMBULATORY_CARE_PROVIDER_SITE_OTHER): Payer: Medicare Other | Admitting: Rehabilitative and Restorative Service Providers"

## 2015-10-31 ENCOUNTER — Encounter: Payer: Self-pay | Admitting: Rehabilitative and Restorative Service Providers"

## 2015-10-31 DIAGNOSIS — Z7409 Other reduced mobility: Secondary | ICD-10-CM | POA: Diagnosis not present

## 2015-10-31 DIAGNOSIS — M542 Cervicalgia: Secondary | ICD-10-CM

## 2015-10-31 DIAGNOSIS — M2560 Stiffness of unspecified joint, not elsewhere classified: Secondary | ICD-10-CM

## 2015-10-31 DIAGNOSIS — M5030 Other cervical disc degeneration, unspecified cervical region: Secondary | ICD-10-CM | POA: Diagnosis not present

## 2015-10-31 DIAGNOSIS — M623 Immobility syndrome (paraplegic): Secondary | ICD-10-CM | POA: Diagnosis not present

## 2015-10-31 NOTE — Therapy (Signed)
[facility] [address] [place], [place], [address] Phone: [phone]   Fax:  [phone]  Physical Therapy Evaluation  Patient Details  Name: Tasha Sheppard MRN: [id number] Date of Birth: [birth year] Referring Provider: Dr. Denyse Amass   Encounter Date: 10/31/2015      PT End of Session - 10/31/15 1406    Visit Number 1   Number of Visits 12   Date for PT Re-Evaluation 12/12/15   PT Start Time 1415   PT Stop Time 1508   PT Time Calculation (min) 53 min   Activity Tolerance Patient tolerated treatment well      Past Medical History  Diagnosis Date  . Hypertension   . Diabetes mellitus without complication (HCC)   . Thyroid disease   . Hyperlipidemia     History reviewed. No pertinent past surgical history.  There were no vitals filed for this visit.  Visit Diagnosis:  DDD (degenerative disc disease), cervical - Plan: PT plan of care cert/re-cert  Cervical pain - Plan: PT plan of care cert/re-cert  Stiffness due to immobility - Plan: PT plan of care cert/re-cert  Impaired mobility and endurance - Plan: PT plan of care cert/re-cert      Subjective Assessment - 10/31/15 1414    Subjective Patient reports that she has had neck pain for the past 3-6 months. she has trouble looking up and her neck hurts every day. She does not know of any injury to her neck. She has arthritis and soteoporosis and she feels current symptoms are related to these conditions.    Pertinent History osteoarthritis; osteoporosis; neck pain for several years; back and shoudler pain for several years    How long can you sit comfortably? prolonged sitting   How long can you stand comfortably? 5-10 min    How long can you walk comfortably? 5-10 min    Diagnostic tests xrays    Patient Stated Goals get rid of neck pain    Currently in Pain? Yes   Pain Score 5    Pain Location Neck   Pain Orientation Posterior   Pain Descriptors / Indicators  Constant;Nagging   Pain Type Chronic pain   Pain Radiating Towards back of her head    Pain Onset More than a month ago   Pain Frequency Constant   Aggravating Factors  sititng; standing; walking; looking up    Pain Relieving Factors looking down; meds help some             [facility] PT Assessment - 10/31/15 0001    Assessment   Medical Diagnosis Cervical DDD    Referring Provider Dr. Denyse Amass    Onset Date/Surgical Date 04/19/15   Hand Dominance Right   Next MD Visit no appt scheduled    Prior Therapy none   Precautions   Precautions None   Balance Screen   Has the patient fallen in the past 6 months No   Has the patient had a decrease in activity level because of a fear of falling?  No   Is the patient reluctant to leave their home because of a fear of falling?  No   Observation/Other Assessments   Focus on Therapeutic Outcomes (FOTO)  65% limitation    Sensation   Additional Comments WFL's per pt report    Posture/Postural Control   Posture Comments head forward and stacked; shoudlers rounded and elevated; scapulae abducted  along the thoracic wall    AROM   Overall AROM Comments  bilat shd elevation WFL's limited end ranges   Cervical Flexion 50   Cervical Extension 41   Cervical - Right Side Bend 35   Cervical - Left Side Bend 30   Cervical - Right Rotation 45   Cervical - Left Rotation 55   Strength   Overall Strength Comments 5/5 bilat UE's    Palpation   Palpation comment muscular tightness occipital area; cervical paraspinals; pecs; upper trap; leveator                   [facility] Adult PT Treatment/Exercise - 10/31/15 0001    Therapeutic Activites    Therapeutic Activities --  ball release work and occipital inhibitation(golf balls)   Neuro Re-ed    Neuro Re-ed Details  working on posture and alignment    Neck Exercises: Standing   Neck Retraction 5 reps;10 secs   Other Standing Exercises scap squeeze 10 sec x 10    Neck Exercises: Seated   Lateral  Flexion Right;Left;5 reps  10 - 15 sec    Moist Heat Therapy   Number Minutes Moist Heat 15 Minutes   Moist Heat Location Cervical;Shoulder   Electrical Stimulation   Electrical Stimulation Location bilat occipital C-spine; paraspinals lower cervical    Electrical Stimulation Action IFC   Electrical Stimulation Parameters to tolerance   Electrical Stimulation Goals Pain;Tone                PT Education - 10/31/15 1450    Education provided Yes   Education Details posture; alignment; myofacial ball work; golf balls for occipital release; HEP    Person(s) Educated Patient;Spouse  Tasha Sheppard   Methods Explanation;Demonstration;Tactile cues;Verbal cues;Handout   Comprehension Verbalized understanding;Returned demonstration;Verbal cues required;Tactile cues required             PT Long Term Goals - 10/31/15 1408    PT LONG TERM GOAL #1   Title Improve postue and alignment with patient to demonstrate improved upright posture 12/12/15   Time 6   Period Weeks   Status New   PT LONG TERM GOAL #2   Title Improve cervical mobility and ROM to WFL's throughout 12/12/15   Time 6   Period Weeks   PT LONG TERM GOAL #3   Title Decrease cervical pain by 50-75% with symptoms progressing form constant to intermittent 12/12/15   Time 6   Period Weeks   Status New   PT LONG TERM GOAL #4   Title I in HEP with husband's assistance 12/12/15    Time 6   Period Weeks   Status New   PT LONG TERM GOAL #5   Title Improve FOTO to </= 5-% limitation 12/12/15   Time 6   Period Weeks   Status New               Plan - 10/31/15 1459    Clinical Impression Statement Tasha Sheppard presents with chronic cervical pain and dysfunction. She has constant pain; poor posture and alignment; limited cervical mobility and ROM; muscular tightness to palpation through the cervical spine area; limited functional activitiy level. Patient will benefit from PT to address problems identified.    Pt will benefit from  skilled therapeutic intervention in order to improve on the following deficits Postural dysfunction;Improper body mechanics;Decreased range of motion;Decreased mobility;Increased fascial restricitons;Decreased endurance;Decreased activity tolerance   Rehab Potential Good   PT Frequency 2x / week   PT Duration 6 weeks   PT Treatment/Interventions Patient/family education;ADLs/Self Care Home Management;Neuromuscular re-education;Therapeutic  exercise;Therapeutic activities;Manual techniques;Dry needling;Cryotherapy;Electrical Stimulation;Iontophoresis 4mg /ml Dexamethasone;Moist Heat;Ultrasound   PT Next Visit Plan potural correction; stretching; gentle posterior shoulder girdle strengthening; manual work; passive stretch for cervical spine; modalities as indicated   PT Home Exercise Plan myofacial ball release work; golf balls for occipital inhibition; postural work; Medical sales representativeHEP    Consulted and Agree with Plan of Care Patient;Family member/caregiver   Family Member Consulted husband, Tasha HillDonald          Problem List Patient Active Problem List   Diagnosis Date Noted  . Degeneration of cervical intervertebral disc 08/09/2015  . Elevated lipase 07/31/2015  . Type 2 diabetes mellitus (HCC) 07/17/2015  . Osteoporosis 07/17/2015  . Hypothyroidism 07/17/2015  . Essential hypertension, benign 07/17/2015  . Memory loss 07/17/2015  . 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 Arthritis, senescent 07/17/2015    Celyn Rober MinionP Holt PT, MPH  10/31/2015, 3:08 PM  [facility] [address] [place], [place], [address] Phone: [phone]   Fax:  [phone]  Name: Tasha Sheppard MRN: [id number] Date of Birth: 03/19/1935

## 2015-10-31 NOTE — Patient Instructions (Signed)
Axial Extension (Chin Tuck)    Pull chin in and lengthen back of neck. Hold __10__ seconds while counting out loud. Repeat _5___ times. Do __3-4_ sessions per day.   Shoulder Blade Squeeze    Rotate shoulders back, then squeeze shoulder blades down and back. Hold 10 sec Repeat __10__ times. Do _3-4___ sessions per day.   AROM: Lateral Neck Flexion    Slowly tilt head toward one shoulder, then the other. Hold each position __15__ seconds. Repeat __10__ times per set. Do __3-4 times per day.   Lying on back, head on pillow with golf balls at the base of the head  2-3 minutes     TENS UNIT: This is helpful for muscle pain and spasm.   Search and Purchase a TENS 7000 2nd edition at www.tenspros.com. It should be less than $30.     TENS unit instructions: Do not shower or bathe with the unit on Turn the unit off before removing electrodes or batteries If the electrodes lose stickiness add a drop of water to the electrodes after they are disconnected from the unit and place on plastic sheet. If you continued to have difficulty, call the TENS unit company to purchase more electrodes. Do not apply lotion on the skin area prior to use. Make sure the skin is clean and dry as this will help prolong the life of the electrodes. After use, always check skin for unusual red areas, rash or other skin difficulties. If there are any skin problems, does not apply electrodes to the same area. Never remove the electrodes from the unit by pulling the wires. Do not use the TENS unit or electrodes other than as directed. Do not change electrode placement without consultating your therapist or physician. Keep 2 fingers with between each electrode.

## 2015-11-05 ENCOUNTER — Ambulatory Visit (INDEPENDENT_AMBULATORY_CARE_PROVIDER_SITE_OTHER): Payer: Medicare Other | Admitting: Rehabilitative and Restorative Service Providers"

## 2015-11-05 ENCOUNTER — Encounter: Payer: Self-pay | Admitting: Rehabilitative and Restorative Service Providers"

## 2015-11-05 ENCOUNTER — Telehealth: Payer: Self-pay | Admitting: Family Medicine

## 2015-11-05 DIAGNOSIS — M5030 Other cervical disc degeneration, unspecified cervical region: Secondary | ICD-10-CM | POA: Diagnosis not present

## 2015-11-05 DIAGNOSIS — M623 Immobility syndrome (paraplegic): Secondary | ICD-10-CM

## 2015-11-05 DIAGNOSIS — Z7409 Other reduced mobility: Secondary | ICD-10-CM

## 2015-11-05 DIAGNOSIS — M2560 Stiffness of unspecified joint, not elsewhere classified: Secondary | ICD-10-CM

## 2015-11-05 DIAGNOSIS — M542 Cervicalgia: Secondary | ICD-10-CM

## 2015-11-05 MED ORDER — INSULIN PEN NEEDLE 31G X 5 MM MISC: Status: DC

## 2015-11-05 NOTE — Telephone Encounter (Signed)
Refill req 

## 2015-11-05 NOTE — Therapy (Signed)
[facility] [address] [place], [place], [address] Phone: [phone]   Fax:  [phone]  Physical Therapy Treatment  Patient Details  Name: Tasha RowanMary Sheppard MRN: [id number] Date of Birth: 02/02/1935 Referring Provider: Dr. Denyse Amassorey   Encounter Date: 11/05/2015      PT End of Session - 11/05/15 1027    Visit Number 2   Number of Visits 12   Date for PT Re-Evaluation 12/12/15   PT Start Time 1027   PT Stop Time 1115   PT Time Calculation (min) 48 min   Activity Tolerance Patient tolerated treatment well      Past Medical History  Diagnosis Date  . Hypertension   . Diabetes mellitus without complication (HCC)   . Thyroid disease   . Hyperlipidemia     History reviewed. No pertinent past surgical history.  There were no vitals filed for this visit.  Visit Diagnosis:  DDD (degenerative disc disease), cervical  Cervical pain  Stiffness due to immobility  Impaired mobility and endurance      Subjective Assessment - 11/05/15 1027    Subjective [name] reports thta the treatment has helped "a little bit". They have a TENs unit which was their granddaughters. Need help to learn how to work it.    Currently in Pain? Yes   Pain Score 5                          [facility] Adult PT Treatment/Exercise - 11/05/15 0001    Self-Care   Self-Care --  education re use/care of TENS unit    Neck Exercises: Standing   Neck Retraction 5 reps;10 secs  2 sets    Other Standing Exercises scap squeeze 10 sec x 10    Neck Exercises: Seated   Lateral Flexion Right;Left;5 reps  10 - 15 sec    Moist Heat Therapy   Number Minutes Moist Heat 15 Minutes   Moist Heat Location Cervical;Shoulder   Electrical Stimulation   Electrical Stimulation Location bilat occipital C-spine; paraspinals lower cervical    Electrical Stimulation Action IFC   Electrical Stimulation Parameters to tolerance   Electrical Stimulation Goals  Pain;Tone   Manual Therapy   Manual therapy comments pt supine    Joint Mobilization cervical CPA mobs Grade II/III   Soft tissue mobilization deep tissure work through the anterior/lateral/posterior cervical musculature; upper trap; leveator    Myofascial Release pecs/ant chest   Passive ROM passive stretching cervical spine in multiple planes    Manual Traction cervical traction 4-5 trials holding 20-30 sec                      PT Long Term Goals - 11/05/15 1058    PT LONG TERM GOAL #1   Title Improve postue and alignment with patient to demonstrate improved upright posture 12/12/15   Time 6   Period Weeks   Status On-going   PT LONG TERM GOAL #2   Title Improve cervical mobility and ROM to WFL's throughout 12/12/15   Time 6   Period Weeks   Status On-going   PT LONG TERM GOAL #3   Title Decrease cervical pain by 50-75% with symptoms progressing form constant to intermittent 12/12/15   Time 6   Period Weeks   Status On-going   PT LONG TERM GOAL #4   Title I in HEP with husband's assistance 12/12/15    Time 6  Period Weeks   Status On-going   PT LONG TERM GOAL #5   Title Improve FOTO to </= 5-% limitation 12/12/15   Time 6   Period Weeks   Status On-going               Plan - 11/05/15 1055    Clinical Impression Statement Sacoya reports positive response to initial treatment. She has less pain in the neck and less headaches. She has not worked on her HEP - she and her husband report that they can't remember. Note less palpable tightness through the cervical musculature. Progressing toward stated goals of therapy.    Pt will benefit from skilled therapeutic intervention in order to improve on the following deficits Postural dysfunction;Improper body mechanics;Decreased range of motion;Decreased mobility;Increased fascial restricitons;Decreased endurance;Decreased activity tolerance   Rehab Potential Good   PT Frequency 2x / week   PT Duration 6 weeks   PT  Treatment/Interventions Patient/family education;ADLs/Self Care Home Management;Neuromuscular re-education;Therapeutic exercise;Therapeutic activities;Manual techniques;Dry needling;Cryotherapy;Electrical Stimulation;Iontophoresis /ml Dexamethasone;Moist Heat;Ultrasound   PT Next Visit Plan potural correction; stretching; gentle posterior shoulder girdle strengthening; manual work; passive stretch for cervical spine; modalities as indicated - compliance with HEP will be difficult due to pt's forgetfullness    PT Home Exercise Plan myofacial ball release work; golf balls for occipital inhibition; postural work; Medical sales representative with Plan of Care Patient        Problem List Patient Active Problem List   Diagnosis Date Noted  . Degeneration of cervical intervertebral disc 08/09/2015  . Elevated lipase 07/31/2015  . Type 2 diabetes mellitus (HCC) 07/17/2015  . Osteoporosis 07/17/2015  . Hypothyroidism 07/17/2015  . Essential hypertension, benign 07/17/2015  . Memory loss 07/17/2015  . 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 Arthritis, senescent 07/17/2015    Tasha Sheppard Tasha Sheppard PT, MPH  11/05/2015, 10:59 AM  [facility] [address] [place], [place], [address] Phone: [phone]   Fax:  [phone]  Name: Tasha Sheppard MRN: [id number] Date of Birth: Jan 04, 1935

## 2015-11-07 ENCOUNTER — Ambulatory Visit (INDEPENDENT_AMBULATORY_CARE_PROVIDER_SITE_OTHER): Payer: Medicare Other | Admitting: Physical Therapy

## 2015-11-07 DIAGNOSIS — M5030 Other cervical disc degeneration, unspecified cervical region: Secondary | ICD-10-CM | POA: Diagnosis not present

## 2015-11-07 DIAGNOSIS — Z7409 Other reduced mobility: Secondary | ICD-10-CM | POA: Diagnosis not present

## 2015-11-07 DIAGNOSIS — M2560 Stiffness of unspecified joint, not elsewhere classified: Secondary | ICD-10-CM

## 2015-11-07 DIAGNOSIS — M542 Cervicalgia: Secondary | ICD-10-CM

## 2015-11-07 DIAGNOSIS — M623 Immobility syndrome (paraplegic): Secondary | ICD-10-CM | POA: Diagnosis not present

## 2015-11-07 NOTE — Therapy (Signed)
[facility] [address] [place], [place], [address] Phone: [phone]   Fax:  [phone]  Physical Therapy Treatment  Patient Details  Name: Tasha Sheppard MRN: [id number] Date of Birth: 03/02/1935 Referring Provider: Dr. Corey   Encounter Date: 11/07/2015      PT End of Session - 11/07/15 1523    Visit Number 3   Number of Visits 12   Date for PT Re-Evaluation 12/12/15   PT Start Time 1517   PT Stop Time 1557   PT Time Calculation (min) 40 min   Activity Tolerance Patient tolerated treatment well;No increased pain      Past Medical History  Diagnosis Date  . Hypertension   . Diabetes mellitus without complication (HCC)   . Thyroid disease   . Hyperlipidemia     No past surgical history on file.  There were no vitals filed for this visit.  Visit Diagnosis:  DDD (degenerative disc disease), cervical  Cervical pain  Stiffness due to immobility  Impaired mobility and endurance      Subjective Assessment - 11/07/15 1524    Subjective Pt reports she can't believe she has been pain free since last visit.  Hasn't done HEP because didn't remember to.    Currently in Pain? No/denies            [facility] PT Assessment - 11/07/15 0001    AROM   Cervical Flexion 62   Cervical Extension 55   Cervical - Right Side Bend 40   Cervical - Left Side Bend 45   Cervical - Right Rotation 72   Cervical - Left Rotation 75           [facility] Adult PT Treatment/Exercise - 11/07/15 0001    Exercises   Exercises Neck;Shoulder   Neck Exercises: Seated   Lateral Flexion Right;Left;5 reps  10 - 15 sec    Neck Exercises: Supine   Other Supine Exercise scap squeeze x 5 sec hold x 10 reps (tactile cues for form)_Chin tucks x 3 sec hold x 12 reps    Shoulder Exercises: Supine   Horizontal ABduction Strengthening;Both;10 reps;Theraband   Theraband Level (Shoulder Horizontal ABduction) Level 1 (Yellow)   External Rotation  Strengthening;Both;10 reps;Theraband   Theraband Level (Shoulder External Rotation) Level 1 (Yellow)   Flexion Strengthening;Both;10 reps;Theraband   Theraband Level (Shoulder Flexion) Level 1 (Yellow)   Other Supine Exercises Sash (yellow band) x 10 each side    Shoulder Exercises: Stretch   Other Shoulder Stretches Doorway stretch mid level position x 30 sec x 2 reps                      PT Long Term Goals - 11/07/15 1543    PT LONG TERM GOAL #1   Title Improve posture and alignment with patient to demonstrate improved upright posture 12/12/15   Time 6   Period Weeks   Status On-going   PT LONG TERM GOAL #2   Title Improve cervical mobility and ROM to WFL's throughout 12/12/15   Time 6   Period Weeks   Status Achieved   PT LONG TERM GOAL #3   Title Decrease cervical pain by 50-75% with symptoms progressing form constant to intermittent 12/12/15   Time 6   Period Weeks   Status On-going   PT LONG TERM GOAL #4   Title I in HEP with husband's assistance 12/12/15    Time 6   Period Weeks     Status On-going   PT LONG TERM GOAL #5   Title Improve FOTO to </= 5-% limitation 12/12/15   Time 6   Period Weeks   Status On-going               Plan - 11/07/15 1546    Clinical Impression Statement Pt demonstrated improved cervical ROM; has met LTG # 2.  Pt tolerated all new exercises without production of pain; required assistance with counting reps.   Modalities held as pt had no pain at end of session and preferred to use TENS at home.    Pt will benefit from skilled therapeutic intervention in order to improve on the following deficits Postural dysfunction;Improper body mechanics;Decreased range of motion;Decreased mobility;Increased fascial restricitons;Decreased endurance;Decreased activity tolerance   Rehab Potential Good   PT Frequency 2x / week   PT Duration 6 weeks   PT Treatment/Interventions Patient/family education;ADLs/Self Care Home  Management;Neuromuscular re-education;Therapeutic exercise;Therapeutic activities;Manual techniques;Dry needling;Cryotherapy;Electrical Stimulation;Iontophoresis 4mg/ml Dexamethasone;Moist Heat;Ultrasound   PT Next Visit Plan Continue postural correction and strengthening of post shoulder girdle.  Manual work to address fascial tightness.    Consulted and Agree with Plan of Care Patient;Family member/caregiver   Family Member Consulted husband, Donald         Problem List Patient Active Problem List   Diagnosis Date Noted  . Degeneration of cervical intervertebral disc 08/09/2015  . Elevated lipase 07/31/2015  . Type 2 diabetes mellitus (HCC) 07/17/2015  . Osteoporosis 07/17/2015  . Hypothyroidism 07/17/2015  . Essential hypertension, benign 07/17/2015  . Memory loss 07/17/2015  . 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 Arthritis, senescent 07/17/2015    Carlson-Long, PTA 11/07/2015 3:59 PM  [facility] [address] [place], , [address] Phone: [phone]   Fax:  [phone]  Name: Tasha Sheppard MRN: [id number] Date of Birth: 06/07/1935     

## 2015-11-07 NOTE — Patient Instructions (Signed)
Over Head Pull: Narrow Grip     [place] [phone]   On back, knees bent, feet flat, band across thighs, elbows straight but relaxed. Pull hands apart (start). Keeping elbows straight, bring arms up and over head, hands toward floor. Keep pull steady on band. Hold momentarily. Return slowly, keeping pull steady, back to start. Repeat _10__ times. Band color __yellow____   Side Pull: Double Arm   On back, knees bent, feet flat. Arms perpendicular to body, shoulder level, elbows straight but relaxed. Pull arms out to sides, elbows straight. Resistance band comes across collarbones, hands toward floor. Hold momentarily. Slowly return to starting position. Repeat _10__ times. Band color __yellow___   Sash   On back, knees bent, feet flat, left hand on left hip, right hand above left. Pull right arm DIAGONALLY (hip to shoulder) across chest. Bring right arm along head toward floor. Hold momentarily. Slowly return to starting position. Repeat _10__ times. Do with left arm. Band color __yellow____   Shoulder Rotation: Double Arm   On back, knees bent, feet flat, elbows tucked at sides, bent 90, hands palms up. Pull hands apart and down toward floor, keeping elbows near sides. Hold momentarily. Slowly return to starting position. Repeat _10__ times. Band color __yellow____    Scapula Adduction With Pectorals, Mid-Range   Stand in doorframe with palms against frame and arms at 90. Lean forward and squeeze shoulder blades. Hold __30_ seconds. Repeat _2__ times per session. Do _2__ sessions per day.   [facility] [address] [place], [place] [address]  [phone] (office) [phone] (fax)

## 2015-11-12 ENCOUNTER — Ambulatory Visit (INDEPENDENT_AMBULATORY_CARE_PROVIDER_SITE_OTHER): Payer: Medicare Other | Admitting: Physical Therapy

## 2015-11-12 DIAGNOSIS — M623 Immobility syndrome (paraplegic): Secondary | ICD-10-CM

## 2015-11-12 DIAGNOSIS — M2560 Stiffness of unspecified joint, not elsewhere classified: Secondary | ICD-10-CM

## 2015-11-12 DIAGNOSIS — M542 Cervicalgia: Secondary | ICD-10-CM

## 2015-11-12 DIAGNOSIS — M5030 Other cervical disc degeneration, unspecified cervical region: Secondary | ICD-10-CM

## 2015-11-12 DIAGNOSIS — Z7409 Other reduced mobility: Secondary | ICD-10-CM | POA: Diagnosis not present

## 2015-11-12 NOTE — Therapy (Signed)
[facility] [place], [place], [address] Phone: [phone]   Fax:  [phone]  Physical Therapy Treatment  Patient Details  Name: Tasha Sheppard MRN: [id number] Date of Birth: 1935/08/04 Referring Provider: Dr. Georgina Snell  Encounter Date: 11/12/2015      PT End of Session - 11/12/15 1453    Visit Number 4   Number of Visits 12   Date for PT Re-Evaluation 12/12/15   PT Start Time 1435   PT Stop Time 1528   PT Time Calculation (min) 53 min   Activity Tolerance Patient tolerated treatment well;No increased pain      Past Medical History  Diagnosis Date  . Hypertension   . Diabetes mellitus without complication ([place])   . Thyroid disease   . Hyperlipidemia     No past surgical history on file.  There were no vitals filed for this visit.  Visit Diagnosis:  DDD (degenerative disc disease), cervical  Cervical pain  Stiffness due to immobility  Impaired mobility and endurance      Subjective Assessment - 11/12/15 1453    Subjective Pt reports her neck pain has returned, but it is not as intense or constant as before.  Has not been doing HEP, since she hasn't remembered to.  Pt states her husband hasn't prompted her to do any exercises, but he is applying TENS to her neck when needed .   Currently in Pain? Yes   Pain Score 4    Pain Location Neck   Pain Orientation Right;Left;Posterior   Pain Descriptors / Indicators Aching   Aggravating Factors  turning head    Pain Relieving Factors therapy.             [facility] PT Assessment - 11/12/15 0001    Assessment   Medical Diagnosis Cervical DDD    Referring Provider Dr. Georgina Snell   Onset Date/Surgical Date 04/19/15   Hand Dominance Right   Next MD Visit no appt scheduled    Prior Therapy none          [facility] Adult PT Treatment/Exercise - 11/12/15 0001    Neck Exercises: Supine   Cervical Rotation Right;Left;5 reps  to tolerance    Shoulder Flexion  Both;10 reps  yellow band, overhead pull   Other Supine Exercise scap squeeze x 5 sec hold x 10 reps (visual cues for form)_   Shoulder Exercises: Supine   Horizontal ABduction Strengthening;Both;10 reps   Theraband Level (Shoulder Horizontal ABduction) Level 1 (Yellow)   External Rotation Strengthening;Both;10 reps;Theraband   Theraband Level (Shoulder External Rotation) Level 1 (Yellow)   Other Supine Exercises Sash (yellow band) x 10 each side    Moist Heat Therapy   Number Minutes Moist Heat 15 Minutes   Moist Heat Location Cervical  upper thoracic   Electrical Stimulation   Electrical Stimulation Location bilat occipital C-spine; paraspinals lower cervical    Electrical Stimulation Action IFC   Electrical Stimulation Parameters to tolerance    Electrical Stimulation Goals Pain;Tone   Manual Therapy   Manual therapy comments pt supine    Soft tissue mobilization to Rt/Lt cervical paraspinals and upper trap.    Myofascial Release pecs/ant chest.   Passive ROM passive stretching cervical spine in multiple planes    Manual Traction cervical traction 4-5 trials holding 20-30 sec            PT Long Term Goals - 11/12/15 1710    PT LONG TERM GOAL #1   Title  Improve posture and alignment with patient to demonstrate improved upright posture 12/12/15   Time 6   Period Weeks   Status On-going   PT LONG TERM GOAL #2   Title Improve cervical mobility and ROM to WFL's throughout 12/12/15   Time 6   Period Weeks   Status Achieved   PT LONG TERM GOAL #3   Title Decrease cervical pain by 50-75% with symptoms progressing form constant to intermittent 12/12/15   Time 6   Period Weeks   Status Achieved   PT LONG TERM GOAL #4   Title I in HEP with husband's assistance 12/12/15    Time 6   Period Weeks   Status On-going   PT LONG TERM GOAL #5   Title Improve FOTO to </= 5-% limitation 12/12/15   Time 6   Period Weeks   Status On-going               Plan - 11/12/15 1518     Clinical Impression Statement Pt continues with decreased pain overall, not as persistant as before.   Pt reported decreased pain with supine ther ex and further reduction to use of heat and estim. cervical ROM continues to improve.  Pt has met LTG #3 and is progressing well towards goals.    Pt will benefit from skilled therapeutic intervention in order to improve on the following deficits Postural dysfunction;Improper body mechanics;Decreased range of motion;Decreased mobility;Increased fascial restricitons;Decreased endurance;Decreased activity tolerance   Rehab Potential Good   PT Frequency 2x / week   PT Duration 6 weeks   PT Treatment/Interventions Patient/family education;ADLs/Self Care Home Management;Neuromuscular re-education;Therapeutic exercise;Therapeutic activities;Manual techniques;Dry needling;Cryotherapy;Electrical Stimulation;Iontophoresis 30m/ml Dexamethasone;Moist Heat;Ultrasound   PT Next Visit Plan Continue postural correction and strengthening of post shoulder girdle.  Manual work to address fascial tightness.    Consulted and Agree with Plan of Care Patient;Family member/caregiver        Problem List Patient Active Problem List   Diagnosis Date Noted  . Degeneration of cervical intervertebral disc 08/09/2015  . Elevated lipase 07/31/2015  . Type 2 diabetes mellitus ([place] 07/17/2015  . Osteoporosis 07/17/2015  . Hypothyroidism 07/17/2015  . Essential hypertension, benign 07/17/2015  . Memory loss 07/17/2015  . 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 Arthritis, senescent 07/17/2015   JKerin Perna PTA 11/12/2015 5:12 PM  [place] [address]Phone: [phone]  Fax:  [phone] Name: Tasha PedrettiMRN: [id number] of Birth: 603/17/1936

## 2015-11-13 ENCOUNTER — Ambulatory Visit (INDEPENDENT_AMBULATORY_CARE_PROVIDER_SITE_OTHER): Payer: Medicare Other | Admitting: Family Medicine

## 2015-11-13 ENCOUNTER — Encounter: Payer: Self-pay | Admitting: Family Medicine

## 2015-11-13 VITALS — BP 118/77 | HR 94

## 2015-11-13 DIAGNOSIS — M25561 Pain in right knee: Secondary | ICD-10-CM

## 2015-11-13 DIAGNOSIS — Z794 Long term (current) use of insulin: Secondary | ICD-10-CM

## 2015-11-13 DIAGNOSIS — I10 Essential (primary) hypertension: Secondary | ICD-10-CM | POA: Diagnosis not present

## 2015-11-13 DIAGNOSIS — M810 Age-related osteoporosis without current pathological fracture: Secondary | ICD-10-CM | POA: Diagnosis not present

## 2015-11-13 DIAGNOSIS — E119 Type 2 diabetes mellitus without complications: Secondary | ICD-10-CM | POA: Diagnosis not present

## 2015-11-13 DIAGNOSIS — M25562 Pain in left knee: Secondary | ICD-10-CM

## 2015-11-13 MED ORDER — ALENDRONATE SODIUM 70 MG PO TABS: 70.0000 mg | ORAL_TABLET | ORAL | Status: DC

## 2015-11-13 MED ORDER — DICLOFENAC SODIUM 50 MG PO TBEC: DELAYED_RELEASE_TABLET | ORAL | Status: DC

## 2015-11-13 NOTE — Progress Notes (Signed)
CC: Hassan RowanMary Herbers is a 81 y.o. female is here for Follow-up   Subjective: HPI:  Follow-up essential hypertension: Since I saw her last she denies any falls or lightheadedness. Denies chest pain shortness of breath or edema.  Follow-up type 2 diabetes: Since I saw her last she began taking metformin. She denies any abdominal discomfort or loose stools. She's tolerating the medication and trying her best to cut back on cookies.  Follow-up osteoporosis: She is requesting a refill on Fosamax. She denies any jaw pain or dental complaints nor any bone pain other than bilateral knee pain. It's been present for matter of years and she actually believes it started in her childhood. She denies any trauma or overexertion. It's described as a soreness and stiffness that does not improve with meloxicam that was given for her neck in the past. No other interventions as of yet. She denies any mechanical symptoms.   Review Of Systems Outlined In HPI  Past Medical History  Diagnosis Date  . Hypertension   . Diabetes mellitus without complication (HCC)   . Thyroid disease   . Hyperlipidemia     No past surgical history on file. Family History  Problem Relation Age of Onset  . Diabetes Mother   . Stroke Mother   . Diabetes Brother   . Stroke Brother     Social History   Social History  . Marital Status: Married    Spouse Name: N/A  . Number of Children: N/A  . Years of Education: N/A   Occupational History  . Not on file.   Social History Main Topics  . Smoking status: Never Smoker   . Smokeless tobacco: Never Used  . Alcohol Use: No  . Drug Use: No  . Sexual Activity: Not on file   Other Topics Concern  . Not on file   Social History Narrative     Objective: BP 118/77 mmHg  Pulse 94  General: Alert and Oriented, No Acute Distress HEENT: Pupils equal, round, reactive to light. Conjunctivae clear.  Moist mucous membranes Lungs: Clear to auscultation bilaterally, no  wheezing/ronchi/rales.  Comfortable work of breathing. Good air movement. Cardiac: Regular rate and rhythm. Normal S1/S2.  No murmurs, rubs, nor gallops.   Extremities: No peripheral edema.  Strong peripheral pulses.  Mental Status: No depression, anxiety, nor agitation. Skin: Warm and dry.  Assessment & Plan: [name] was seen today for follow-up.  Diagnoses and all orders for this visit:  Essential hypertension, benign  Type 2 diabetes mellitus without complication, with long-term current use of insulin (HCC)  Osteoporosis -     alendronate (FOSAMAX) 70 MG tablet; Take 1 tablet (70 mg total) by mouth once a week. Take with a full glass of water on an empty stomach.  Bilateral knee pain  Other orders -     diclofenac (VOLTAREN) 50 MG EC tablet; Take one tablet every 8 hours only as needed for pain, take with small snack.   Essential hypertension: Controlled without any antihypertensives Type 2 diabetes: She is not yet due for a A1c but she is tolerating metformin and Lantus Osteoporosis: Stable with Fosamax Bilateral knee pain: Trial of diclofenac tablets and if no better after a few days  I've asked her to notify her sports medicine specialist next week   Return for June blood sugar follow up..Marland Kitchen

## 2015-11-14 ENCOUNTER — Encounter: Payer: Self-pay | Admitting: Rehabilitative and Restorative Service Providers"

## 2015-11-14 ENCOUNTER — Ambulatory Visit (INDEPENDENT_AMBULATORY_CARE_PROVIDER_SITE_OTHER): Payer: Medicare Other | Admitting: Rehabilitative and Restorative Service Providers"

## 2015-11-14 DIAGNOSIS — M542 Cervicalgia: Secondary | ICD-10-CM | POA: Diagnosis not present

## 2015-11-14 DIAGNOSIS — Z7409 Other reduced mobility: Secondary | ICD-10-CM

## 2015-11-14 DIAGNOSIS — M5030 Other cervical disc degeneration, unspecified cervical region: Secondary | ICD-10-CM | POA: Diagnosis not present

## 2015-11-14 DIAGNOSIS — M2560 Stiffness of unspecified joint, not elsewhere classified: Secondary | ICD-10-CM

## 2015-11-14 DIAGNOSIS — M623 Immobility syndrome (paraplegic): Secondary | ICD-10-CM

## 2015-11-14 NOTE — Therapy (Signed)
[facility] [address] [place], [place], [address] Phone: [phone]   Fax:  [phone]  Physical Therapy Treatment  Patient Details  Name: Tasha RowanMary Sheppard MRN: [id number] Date of Birth: 04/11/1935 Referring Provider: Dr. Denyse Amassorey  Encounter Date: 11/14/2015      PT End of Session - 11/14/15 1523    Visit Number 5   Number of Visits 12   Date for PT Re-Evaluation 12/12/15   PT Start Time 1519   PT Stop Time 1612   PT Time Calculation (min) 53 min   Activity Tolerance Patient tolerated treatment well      Past Medical History  Diagnosis Date  . Hypertension   . Diabetes mellitus without complication (HCC)   . Thyroid disease   . Hyperlipidemia     History reviewed. No pertinent past surgical history.  There were no vitals filed for this visit.  Visit Diagnosis:  DDD (degenerative disc disease), cervical  Cervical pain  Stiffness due to immobility  Impaired mobility and endurance      Subjective Assessment - 11/14/15 1524    Subjective No neck pain; no headaches; sleeping well. Very pleased with her progress. She is not doing her exercises at home.    Currently in Pain? No/denies                         [facility] Treatment/Exercise - 11/14/15 0001    Neck Exercises: Machines for Strengthening   UBE (Upper Arm Bike) L2 x 4 min alt fwd/back   Neck Exercises: Standing   Neck Retraction 5 reps;10 secs   Neck Exercises: Supine   Neck Retraction 5 reps;10 secs   Shoulder Exercises: Stretch   Other Shoulder Stretches Doorway stretch mid level position x 30 sec x 2 reps    Moist Heat Therapy   Number Minutes Moist Heat 15 Minutes   Moist Heat Location Cervical  upper thoracic   Electrical Stimulation   Electrical Stimulation Location bilat occipital C-spine; paraspinals lower cervical    Electrical Stimulation Action IFC   Electrical Stimulation Parameters to tolerance   Electrical  Stimulation Goals Pain;Tone   Manual Therapy   Manual therapy comments pt supine    Joint Mobilization cervical CPA mobs Grade II/III   Soft tissue mobilization to Rt/Lt cervical paraspinals and upper trap.    Myofascial Release pecs/ant chest.   Passive ROM passive stretching cervical spine in multiple planes    Manual Traction cervical traction 4-5 trials holding 20-30 sec                      PT Long Term Goals - 11/12/15 1710    PT LONG TERM GOAL #1   Title Improve posture and alignment with patient to demonstrate improved upright posture 12/12/15   Time 6   Period Weeks   Status On-going   PT LONG TERM GOAL #2   Title Improve cervical mobility and ROM to WFL's throughout 12/12/15   Time 6   Period Weeks   Status Achieved   PT LONG TERM GOAL #3   Title Decrease cervical pain by 50-75% with symptoms progressing form constant to intermittent 12/12/15   Time 6   Period Weeks   Status Achieved   PT LONG TERM GOAL #4   Title I in HEP with husband's assistance 12/12/15    Time 6   Period Weeks   Status On-going   PT LONG  TERM GOAL #5   Title Improve FOTO to </= 5-% limitation 12/12/15   Time 6   Period Weeks   Status On-going               Plan - 11/14/15 1525    Clinical Impression Statement Responding well to treatment with good resolution of pain and headaches. HEP not realistic due to memory problems. Difficult to interest patient's husband in supervision exercises at home. Patient has continued tightness through cervical area in the deeper paraspinals and scaleni musculature Rt > Lt. Will benefit form continued treatment with focus on manual work to release muscular tightness and  avoid recurrent problems.    Pt will benefit from skilled therapeutic intervention in order to improve on the following deficits Postural dysfunction;Improper body mechanics;Decreased range of motion;Decreased mobility;Increased fascial restricitons;Decreased endurance;Decreased  activity tolerance   Rehab Potential Good   PT Frequency 2x / week   PT Duration 6 weeks   PT Treatment/Interventions Patient/family education;ADLs/Self Care Home Management;Neuromuscular re-education;Therapeutic exercise;Therapeutic activities;Manual techniques;Dry needling;Cryotherapy;Electrical Stimulation;Iontophoresis /ml Dexamethasone;Moist Heat;Ultrasound   PT Next Visit Plan Continue postural correction and strengthening of post shoulder girdle.  Manual work to address fascial tightness.    PT Home Exercise Plan myofacial ball release work; golf balls for occipital inhibition; postural work; Medical sales representative with Plan of Care Patient        Problem List Patient Active Problem List   Diagnosis Date Noted  . Degeneration of cervical intervertebral disc 08/09/2015  . Elevated lipase 07/31/2015  . Type 2 diabetes mellitus (HCC) 07/17/2015  . Osteoporosis 07/17/2015  . Hypothyroidism 07/17/2015  . Essential hypertension, benign 07/17/2015  . Memory loss 07/17/2015  . 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 Arthritis, senescent 07/17/2015    Josslin Sanjuan Rober Minion PT, MPH  11/14/2015, 4:05 PM  [facility] [address] [place], [place], [address] Phone: [phone]   Fax:  [phone]  Name: Tasha Sheppard MRN: [id number] Date of Birth: 1935/03/13

## 2015-11-22 ENCOUNTER — Ambulatory Visit (INDEPENDENT_AMBULATORY_CARE_PROVIDER_SITE_OTHER): Payer: Medicare Other | Admitting: Rehabilitative and Restorative Service Providers"

## 2015-11-22 ENCOUNTER — Ambulatory Visit (INDEPENDENT_AMBULATORY_CARE_PROVIDER_SITE_OTHER): Payer: Medicare Other | Admitting: Family Medicine

## 2015-11-22 ENCOUNTER — Ambulatory Visit (INDEPENDENT_AMBULATORY_CARE_PROVIDER_SITE_OTHER): Payer: Medicare Other

## 2015-11-22 ENCOUNTER — Encounter: Payer: Self-pay | Admitting: Family Medicine

## 2015-11-22 ENCOUNTER — Encounter: Payer: Self-pay | Admitting: Rehabilitative and Restorative Service Providers"

## 2015-11-22 VITALS — BP 127/72 | HR 97 | Wt 150.0 lb

## 2015-11-22 DIAGNOSIS — M25561 Pain in right knee: Secondary | ICD-10-CM

## 2015-11-22 DIAGNOSIS — M542 Cervicalgia: Secondary | ICD-10-CM | POA: Diagnosis not present

## 2015-11-22 DIAGNOSIS — M2560 Stiffness of unspecified joint, not elsewhere classified: Secondary | ICD-10-CM

## 2015-11-22 DIAGNOSIS — M5030 Other cervical disc degeneration, unspecified cervical region: Secondary | ICD-10-CM

## 2015-11-22 DIAGNOSIS — M623 Immobility syndrome (paraplegic): Secondary | ICD-10-CM

## 2015-11-22 DIAGNOSIS — M25562 Pain in left knee: Secondary | ICD-10-CM

## 2015-11-22 DIAGNOSIS — Z7409 Other reduced mobility: Secondary | ICD-10-CM

## 2015-11-22 DIAGNOSIS — M8588 Other specified disorders of bone density and structure, other site: Secondary | ICD-10-CM | POA: Diagnosis not present

## 2015-11-22 NOTE — Assessment & Plan Note (Signed)
Very likely due to DJD. X-rays pending. Improvement status postinjection. Return in 4 weeks for recheck and reevaluation.

## 2015-11-22 NOTE — Assessment & Plan Note (Signed)
Much better with physical therapy. Continue as needed.

## 2015-11-22 NOTE — Progress Notes (Signed)
Tasha Sheppard is a 81 y.o. female who presents to [facility] Primary Care today for follow-up neck pain and discuss new bilateral knee pain.  Patient was seen last month complaining of neck pain. This is found to be due to significant multilevel DDD. She's had considerable improvement with physical therapy. She feels much better.  Additionally patient has bilateral knee pain. Pain is present with activity and mildly improved with rest. She's had this pain in her entire life. She takes diclofenac which helps some. She denies any recent injury locking catching or giving way.   Past Medical History  Diagnosis Date  . Hypertension   . Diabetes mellitus without complication (HCC)   . Thyroid disease   . Hyperlipidemia    No past surgical history on file. Social History  Substance Use Topics  . Smoking status: Never Smoker   . Smokeless tobacco: Never Used  . Alcohol Use: No   family history includes Diabetes in her brother and mother; Stroke in her brother and mother.  ROS as above Medications: Current Outpatient Prescriptions  Medication Sig Dispense Refill  . alendronate (FOSAMAX) 70 MG tablet Take 1 tablet (70 mg total) by mouth once a week. Take with a full glass of water on an empty stomach. 12 tablet 11  . aspirin EC 81 MG tablet Take 1 tablet (81 mg total) by mouth daily.    . diclofenac (VOLTAREN) 50 MG EC tablet Take one tablet every 8 hours only as needed for pain, take with small snack. 45 tablet 0  . Insulin Glargine (LANTUS SOLOSTAR) 100 UNIT/ML Solostar Pen Inject 10 Units into the skin daily at 10 pm. 5 pen PRN  . Insulin Pen Needle (B-D UF III MINI PEN NEEDLES) 31G X 5 MM MISC Use a new needle daily to inject insulin subcutaneously.  Dx: Type 2 Diabetes. 50 each 11  . levothyroxine (SYNTHROID, LEVOTHROID) 100 MCG tablet Take 1 tablet (100 mcg total) by mouth daily. 90 tablet 1  .  memantine (NAMENDA) 5 MG tablet Take 1 tablet (5 mg total) by mouth 2 (two) times daily. 60 tablet 2  . metFORMIN (GLUCOPHAGE) 1000 MG tablet One tablet by mouth every evening for blood sugar control. 30 tablet 2  . rosuvastatin (CRESTOR) 20 MG tablet Take 20 mg by mouth daily.     No current facility-administered medications for this visit.   Allergies  Allergen Reactions  . Aricept [Donepezil Hcl]     Possibly causing worsened nausea  . Influenza Vaccines     "bad reaction"  . Pioglitazone     confusion  . Trazodone And Nefazodone     Confusion, anxiety     Exam:  BP 127/72 mmHg  Pulse 97  Wt 150 lb (68.04 kg) Gen: Well NAD HEENT: EOMI,  MMM Lungs: Normal work of breathing. CTABL Heart: RRR no MRG Abd: NABS, Soft. Nondistended, Nontender Exts: Brisk capillary refill, warm and well perfused.  Neck nontender normal motion Knees normal-appearing bilaterally with mild effusion. Full motion throughout. Stable ligamentous exam. Nontender.  Procedure: Real-time Ultrasound Guided Injection of Left Knee  Device: GE Logiq E  Images permanently stored and available for review in the ultrasound unit. Verbal informed consent obtained. Discussed risks and benefits of procedure. Warned about infection bleeding damage to structures skin hypopigmentation and fat atrophy among others. Patient expresses understanding and agreement Time-out conducted.  Noted no overlying erythema, induration, or other signs of local infection.  Skin prepped  in a sterile fashion.  Local anesthesia: Topical Ethyl chloride.  With sterile technique and under real time ultrasound guidance: 80 mg of Kenalog and 4 mL of Marcaine injected easily.  Completed without difficulty  Pain immediately resolved suggesting accurate placement of the medication.  Advised to call if fevers/chills, erythema, induration, drainage, or persistent bleeding.  Images permanently stored and available for review in the  ultrasound unit.  Impression: Technically successful ultrasound guided injection.   Procedure: Real-time Ultrasound Guided Injection of right Knee  Device: GE Logiq E  Images permanently stored and available for review in the ultrasound unit. Verbal informed consent obtained. Discussed risks and benefits of procedure. Warned about infection bleeding damage to structures skin hypopigmentation and fat atrophy among others. Patient expresses understanding and agreement Time-out conducted.  Noted no overlying erythema, induration, or other signs of local infection.  Skin prepped in a sterile fashion.  Local anesthesia: Topical Ethyl chloride.  With sterile technique and under real time ultrasound guidance: 80 mg of Kenalog and 4 mL of Marcaine injected easily.  Completed without difficulty  Pain immediately resolved suggesting accurate placement of the medication.  Advised to call if fevers/chills, erythema, induration, drainage, or persistent bleeding.  Images permanently stored and available for review in the ultrasound unit.  Impression: Technically successful ultrasound guided injection.   Ultrasound approach necessary due to difficulty with anterior approach due to DJD.  No results found for this or any previous visit (from the past 24 hour(s)). No results found.   Please see individual assessment and plan sections.

## 2015-11-22 NOTE — Patient Instructions (Signed)
Thank you for coming in today. Call or go to the ER if you develop a large red swollen joint with extreme pain or oozing puss.  Return in 4 weeks for knee pain follow up.  Get xray today.

## 2015-11-22 NOTE — Therapy (Signed)
[facility] [place] [address] [place], [place], [address] Phone: [phone]   Fax:  [phone]  Physical Therapy Treatment  Patient Details  Name: Tasha Sheppard MRN: [id number] Date of Birth: 1935-06-26 Referring Provider: Dr. Denyse Amass  Encounter Date: 11/22/2015      PT End of Session - 11/22/15 1456    Visit Number 6   Number of Visits 12   Date for PT Re-Evaluation 12/12/15   PT Start Time 0252   PT Stop Time 0338   PT Time Calculation (min) 46 min   Activity Tolerance Patient tolerated treatment well      Past Medical History  Diagnosis Date  . Hypertension   . Diabetes mellitus without complication (HCC)   . Thyroid disease   . Hyperlipidemia     History reviewed. No pertinent past surgical history.  There were no vitals filed for this visit.  Visit Diagnosis:  Cervical pain - Plan: PT plan of care cert/re-cert  Stiffness due to immobility - Plan: PT plan of care cert/re-cert      Subjective Assessment - 11/22/15 1456    Subjective No neck pain; no headaches; sleeping well. Last treatment was very helpful. She felt great. Still feels tightness and discomfort in the neck at times. Would like to continue with PT. She is not doing her exercises at home.    Currently in Pain? No/denies            [facility] PT Assessment - 11/22/15 0001    Assessment   Medical Diagnosis Cervical DDD    Referring Provider Dr. Denyse Amass   Onset Date/Surgical Date 04/19/15   Hand Dominance Right   Next MD Visit no appt scheduled    Prior Therapy none   AROM   Cervical Flexion 72   Cervical Extension 53   Cervical - Right Side Bend 40   Cervical - Left Side Bend 42   Cervical - Right Rotation 72   Cervical - Left Rotation 74   Palpation   Palpation comment good improvement in muscular tightness occipital area; cervical paraspinals; pecs; upper trap; leveator                     [facility] Adult PT Treatment/Exercise -  11/22/15 0001    Neck Exercises: Standing   Neck Retraction 5 reps;10 secs   Neck Exercises: Supine   Neck Retraction 5 reps;10 secs   Other Supine Exercise scap squeeze x 5 sec hold x 10 reps (visual cues for form)_   Shoulder Exercises: Stretch   Other Shoulder Stretches Doorway stretch mid level position x 30 sec x 2 reps    Moist Heat Therapy   Number Minutes Moist Heat 15 Minutes   Moist Heat Location Cervical  upper thoracic   Electrical Stimulation   Electrical Stimulation Location bilat occipital C-spine; paraspinals lower cervical    Electrical Stimulation Action IFC   Electrical Stimulation Parameters to tolerance   Electrical Stimulation Goals Pain;Tone   Manual Therapy   Manual therapy comments pt supine    Joint Mobilization cervical CPA mobs Grade II/III   Soft tissue mobilization to Rt/Lt cervical paraspinals and upper trap.    Myofascial Release pecs/ant chest.   Passive ROM passive stretching cervical spine in multiple planes    Manual Traction cervical traction 4-5 trials holding 20-30 sec                      PT Long Term  Goals - 11/22/15 1521    PT LONG TERM GOAL #1   Title Improve posture and alignment with patient to demonstrate improved upright posture 12/12/15   Time 6   Period Weeks   Status Achieved   PT LONG TERM GOAL #2   Title Improve cervical mobility and ROM to WFL's throughout 12/12/15   Time 6   Period Weeks   Status Achieved   PT LONG TERM GOAL #3   Title Decrease cervical pain by 50-75% with symptoms progressing form constant to intermittent 12/12/15   Time 6   Period Weeks   Status Achieved   PT LONG TERM GOAL #4   Title I in HEP with husband's assistance 12/12/15    Time 6   Period Weeks   Status On-going   PT LONG TERM GOAL #5   Title Improve FOTO to </= 5-% limitation 12/12/15   Time 6   Period Weeks   Status On-going               Plan - 11/22/15 1519    Clinical Impression Statement Progressing well with  less pain and tightness in the neck - just continued tightness. Pt has no reported headaches in the past 2-3 weeks. Progressing well with PT. Anticipate d/c in 1-2 visits Progressing well.    Pt will benefit from skilled therapeutic intervention in order to improve on the following deficits Postural dysfunction;Improper body mechanics;Decreased range of motion;Decreased mobility;Increased fascial restricitons;Decreased endurance;Decreased activity tolerance   Rehab Potential Good   PT Frequency 2x / week   PT Duration 6 weeks   PT Treatment/Interventions Patient/family education;ADLs/Self Care Home Management;Neuromuscular re-education;Therapeutic exercise;Therapeutic activities;Manual techniques;Dry needling;Cryotherapy;Electrical Stimulation;Iontophoresis 4mg /ml Dexamethasone;Moist Heat;Ultrasound   PT Next Visit Plan Continue postural correction and strengthening of post shoulder girdle.  Manual work to address fascial tightness.    PT Home Exercise Plan myofacial ball release work; golf balls for occipital inhibition; postural work; Medical sales representativeHEP    Consulted and Agree with Plan of Care Patient        Problem List Patient Active Problem List   Diagnosis Date Noted  . Knee pain, bilateral 11/22/2015  . Degeneration of cervical intervertebral disc 08/09/2015  . Elevated lipase 07/31/2015  . Type 2 diabetes mellitus (HCC) 07/17/2015  . Osteoporosis 07/17/2015  . Hypothyroidism 07/17/2015  . Essential hypertension, benign 07/17/2015  . Memory loss 07/17/2015  . 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 Arthritis, senescent 07/17/2015    Saraiyah Hemminger Rober MinionP Jacky Dross PT, MPH  11/22/2015, 3:26 PM  [facility] [address] [place], [place], [address] Phone: [phone]   Fax:  [phone]  Name: Tasha RowanMary Sheppard MRN: [id number] Date of Birth: 11/04/1934

## 2015-11-23 NOTE — Progress Notes (Signed)
Quick Note:  arthritis is present in both knees. ______

## 2015-11-24 ENCOUNTER — Other Ambulatory Visit: Payer: Self-pay | Admitting: Family Medicine

## 2015-11-26 ENCOUNTER — Other Ambulatory Visit: Payer: Self-pay

## 2015-11-26 DIAGNOSIS — F0390 Unspecified dementia without behavioral disturbance: Secondary | ICD-10-CM

## 2015-11-26 MED ORDER — MEMANTINE HCL 5 MG PO TABS: 5.0000 mg | ORAL_TABLET | Freq: Two times a day (BID) | ORAL | Status: DC

## 2015-11-29 ENCOUNTER — Encounter: Payer: Self-pay | Admitting: Rehabilitative and Restorative Service Providers"

## 2015-11-29 ENCOUNTER — Ambulatory Visit (INDEPENDENT_AMBULATORY_CARE_PROVIDER_SITE_OTHER): Payer: Medicare Other | Admitting: Rehabilitative and Restorative Service Providers"

## 2015-11-29 DIAGNOSIS — Z7409 Other reduced mobility: Secondary | ICD-10-CM

## 2015-11-29 DIAGNOSIS — M542 Cervicalgia: Secondary | ICD-10-CM | POA: Diagnosis not present

## 2015-11-29 DIAGNOSIS — M5030 Other cervical disc degeneration, unspecified cervical region: Secondary | ICD-10-CM | POA: Diagnosis not present

## 2015-11-29 NOTE — Therapy (Addendum)
[place], [place], [address] Phone: [phone]   Fax:  [phone]  Physical Therapy Treatment  Patient Details  Name: Tasha Sheppard MRN: [id number] Date of Birth: Jan 28, 1935 Referring Provider: Dr. Georgina Snell   Encounter Date: 11/29/2015      PT End of Session - 11/29/15 1406    Visit Number 7   Number of Visits 12   Date for PT Re-Evaluation 12/12/15   PT Start Time 6283   PT Stop Time 1451   PT Time Calculation (min) 49 min   Activity Tolerance Patient tolerated treatment well      Past Medical History  Diagnosis Date  . Hypertension   . Diabetes mellitus without complication ([place])   . Thyroid disease   . Hyperlipidemia     History reviewed. No pertinent past surgical history.  There were no vitals filed for this visit.      Subjective Assessment - 11/29/15 1405    Subjective No neck pain; no headaches; sleeping well. Therapy is very helpful. She felt great. Still feels tightness and discomfort in the neck at times. Would like to continue with PT. She is not doing her exercises at home. Husband reports that pt has dementia and it is difficult to get her in for PT.    Currently in Pain? No/denies            [facility] PT Assessment - 11/29/15 0001    Assessment   Medical Diagnosis Cervical DDD    Referring Provider Dr. Georgina Snell    Onset Date/Surgical Date 04/19/15   Hand Dominance Right   Next MD Visit no appt scheduled    Observation/Other Assessments   Focus on Therapeutic Outcomes (FOTO)  57% limitation   AROM   Cervical Flexion 70   Cervical Extension 49   Cervical - Right Side Bend 37   Cervical - Left Side Bend 35   Cervical - Right Rotation 68   Cervical - Left Rotation 70   Palpation   Palpation comment good improvement in muscular tightness occipital area; cervical paraspinals; pecs; upper trap; leveator                     [facility] Adult PT Treatment/Exercise -  11/29/15 0001    Neck Exercises: Machines for Strengthening   UBE (Upper Arm Bike) L2 x 4 min alt fwd/back   Neck Exercises: Standing   Neck Retraction 5 reps;10 secs  requires VC/TC    Neck Exercises: Supine   Neck Retraction 5 reps;10 secs   Cervical Rotation Right;Left;5 reps   Shoulder Exercises: Standing   Extension Strengthening;Both;20 reps;Theraband   Theraband Level (Shoulder Extension) Level 2 (Red)   Row Strengthening;Both;20 reps;Theraband   Theraband Level (Shoulder Row) Level 2 (Red)   Retraction Strengthening;Both;20 reps;Theraband   Theraband Level (Shoulder Retraction) Level 2 (Red)   Other Standing Exercises wall push up x 10    Shoulder Exercises: Stretch   Other Shoulder Stretches Doorway stretch mid level position x 30 sec x 2 reps    Moist Heat Therapy   Number Minutes Moist Heat 15 Minutes   Moist Heat Location Cervical  upper thoracic   Electrical Stimulation   Electrical Stimulation Location bilat occipital C-spine; paraspinals lower cervical    Electrical Stimulation Action IFC   Electrical Stimulation Parameters to tolerance   Electrical Stimulation Goals Pain;Tone   Manual Therapy   Manual therapy comments pt supine    Joint Mobilization cervical  CPA mobs Grade II/III   Soft tissue mobilization to Rt/Lt cervical paraspinals and upper trap.    Myofascial Release pecs/ant chest.   Passive ROM passive stretching cervical spine in multiple planes    Manual Traction cervical traction 4-5 trials holding 20-30 sec                      PT Long Term Goals - 11/29/15 1458    PT LONG TERM GOAL #1   Title Improve posture and alignment with patient to demonstrate improved upright posture 12/12/15   Time 6   Period Weeks   Status Achieved   PT LONG TERM GOAL #2   Title Improve cervical mobility and ROM to WFL's throughout 12/12/15   Time 6   Period Weeks   Status Achieved   PT LONG TERM GOAL #3   Title Decrease cervical pain by 50-75% with  symptoms progressing form constant to intermittent 12/12/15   Time 6   Period Weeks   Status Achieved   PT LONG TERM GOAL #4   Title I in HEP with husband's assistance 12/12/15    Time 6   Period Weeks   Status Not Met   PT LONG TERM GOAL #5   Title Improve FOTO to </= 50% limitation 12/12/15   Status Not Met  FOTO 57% limitation               Plan - 11/29/15 1423    Clinical Impression Statement Excellent progress with decreased muscular tightness and improved cervical ROM. No headaches and very little neck pain. Not doing HEP and husband is unable to help with exercises. Will d/c - husband reports some difficulty in getting her in for PT due to dementia. Pt unsure if she wnts to continue PT. We will leave chart open and Raeleen or her husband can call for another appt.    Rehab Potential Good   PT Frequency 2x / week   PT Duration 6 weeks   PT Treatment/Interventions Patient/family education;ADLs/Self Care Home Management;Neuromuscular re-education;Therapeutic exercise;Therapeutic activities;Manual techniques;Dry needling;Cryotherapy;Electrical Stimulation;Iontophoresis 26m/ml Dexamethasone;Moist Heat;Ultrasound   PT Next Visit Plan D/C    PT Home Exercise Plan myofacial ball release work; golf balls for occipital inhibition; postural work; HFurniture conservator/restorerwith Plan of Care Patient      Patient will benefit from skilled therapeutic intervention in order to improve the following deficits and impairments:  Postural dysfunction, Improper body mechanics, Decreased range of motion, Decreased mobility, Increased fascial restricitons, Decreased endurance, Decreased activity tolerance  Visit Diagnosis: Cervical pain  Cervicalgia  DDD (degenerative disc disease), cervical  Impaired mobility and endurance     Problem List Patient Active Problem List   Diagnosis Date Noted  . Knee pain, bilateral 11/22/2015  . Degeneration of cervical intervertebral disc 08/09/2015   . Elevated lipase 07/31/2015  . [place] 2 diabetes mellitus ([place] 07/17/2015  . Osteoporosis 07/17/2015  . Hypothyroidism 07/17/2015  . Essential hypertension, benign 07/17/2015  . Memory loss 07/17/2015  . 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 Arthritis, senescent 07/17/2015    Camerin Ladouceur PNilda SimmerPT, MPH  11/29/2015, 3:25 PM  [facility] [address]Phone: [phone]  Fax:  [phone] Name: MBritiney BlahnikMRN: [id number] of Birth: [birth year]   PHYSICAL THERAPY DISCHARGE SUMMARY  Visits from Start of Care: 7  Current functional level related to goals / functional outcomes: Patient  reports excellent progress with therapy. She has less neck pain and no longer has headaches.   Remaining deficits: Patient's husband reports that patient will complain at times of neck pain.    Education / Equipment: HEP - not likely that patient or husband are compliant with HEP  Plan: Patient agrees to discharge.  Patient goals were partially met. Patient is being discharged due to being pleased with the current functional level.  ?????   Sean Macwilliams P. Helene Kelp PT, MPH 01/23/2016 9:54 AM

## 2015-12-14 ENCOUNTER — Other Ambulatory Visit: Payer: Self-pay | Admitting: Family Medicine

## 2015-12-24 ENCOUNTER — Ambulatory Visit (INDEPENDENT_AMBULATORY_CARE_PROVIDER_SITE_OTHER): Payer: Medicare Other | Admitting: Family Medicine

## 2015-12-24 ENCOUNTER — Encounter: Payer: Self-pay | Admitting: Family Medicine

## 2015-12-24 VITALS — BP 141/83 | HR 98 | Wt 143.0 lb

## 2015-12-24 DIAGNOSIS — M25562 Pain in left knee: Secondary | ICD-10-CM | POA: Diagnosis not present

## 2015-12-24 DIAGNOSIS — M25561 Pain in right knee: Secondary | ICD-10-CM

## 2015-12-24 DIAGNOSIS — N3000 Acute cystitis without hematuria: Secondary | ICD-10-CM | POA: Diagnosis not present

## 2015-12-24 DIAGNOSIS — M5030 Other cervical disc degeneration, unspecified cervical region: Secondary | ICD-10-CM | POA: Diagnosis not present

## 2015-12-24 MED ORDER — CEPHALEXIN 500 MG PO CAPS: 500.0000 mg | ORAL_CAPSULE | Freq: Two times a day (BID) | ORAL | Status: DC

## 2015-12-24 NOTE — Assessment & Plan Note (Signed)
Continue physical therapy as needed.

## 2015-12-24 NOTE — Progress Notes (Signed)
       Tasha Sheppard is a 81 y.o. female [facility] today for follow-up knee pain neck pain and discuss UTI.Marland Kitchen.  1) neck pain: Much improved with physical therapy. Patient feels 100% better.  2) knee pain bilaterally due to DJD. Improved following injection. Pain is slightly returned. Overall she feels about 80% better. She does not take medications daily for knee pain.  3) UTI: Patient notes urinary frequency urgency and dysuria. Symptoms are consistent with previous episodes of UTI. No treatment tried yet. Symptoms present for a few days. No vaginal discharge.   Past Medical History  Diagnosis Date  . Hypertension   . Diabetes mellitus without complication (HCC)   . Thyroid disease   . Hyperlipidemia    No past surgical history on file. Social History  Substance Use Topics  . Smoking status: Never Smoker   . Smokeless tobacco: Never Used  . Alcohol Use: No   family history includes Diabetes in her brother and mother; Stroke in her brother and mother.  ROS as above Medications: Current Outpatient Prescriptions  Medication Sig Dispense Refill  . alendronate (FOSAMAX) 70 MG tablet Take 1 tablet (70 mg total) by mouth once a week. Take with a full glass of water on an empty stomach. 12 tablet 11  . aspirin EC 81 MG tablet Take 1 tablet (81 mg total) by mouth daily.    . diclofenac (VOLTAREN) 50 MG EC tablet Take one tablet every 8 hours only as needed for pain, take with small snack. 45 tablet 0  . Insulin Glargine (LANTUS SOLOSTAR) 100 UNIT/ML Solostar Pen Inject 10 Units into the skin daily at 10 pm. 5 pen PRN  . Insulin Pen Needle (B-D UF III MINI PEN NEEDLES) 31G X 5 MM MISC Use a new needle daily to inject insulin subcutaneously.  Dx: Type 2 Diabetes. 50 each 11  . levothyroxine (SYNTHROID, LEVOTHROID) 100 MCG tablet TAKE ONE TABLET BY MOUTH ONCE DAILY 90 tablet 0    . memantine (NAMENDA) 5 MG tablet Take 1 tablet (5 mg total) by mouth 2 (two) times daily. 60 tablet 2  . metFORMIN (GLUCOPHAGE) 1000 MG tablet One tablet by mouth every evening for blood sugar control. 30 tablet 2  . rosuvastatin (CRESTOR) 20 MG tablet Take 20 mg by mouth daily.    . cephALEXin (KEFLEX) 500 MG capsule Take 1 capsule (500 mg total) by mouth 2 (two) times daily. 14 capsule 0   No current facility-administered medications for this visit.   Allergies  Allergen Reactions  . Aricept [Donepezil Hcl]     Possibly causing worsened nausea  . Influenza Vaccines     "bad reaction"  . Pioglitazone     confusion  . Trazodone And Nefazodone     Confusion, anxiety     Exam:  BP 141/83 mmHg  Pulse 98  Wt 143 lb (64.864 kg) Gen: Well NAD HEENT: EOMI,  MMM Lungs: Normal work of breathing. CTABL Heart: RRR no MRG Abd: NABS, Soft. Nondistended, Nontender No CVA angle tenderness to percussion Exts: Brisk capillary refill, warm and well perfused.  Neck: Normal motion Knees: Normal motion normal gait  No results found for this or any previous visit (from the past 24 hour(s)). No results found.   Please see individual assessment and plan sections.

## 2015-12-24 NOTE — Assessment & Plan Note (Signed)
Urine culture pending. Treat empirically with Keflex. Return as needed.

## 2015-12-24 NOTE — Assessment & Plan Note (Signed)
Treatment with Tylenol. Return for injection as needed

## 2015-12-24 NOTE — Addendum Note (Signed)
Addended by: Minna AntisBRIGHAM, Kiaria Quinnell T on: 12/24/2015 03:53 PM   Modules accepted: Orders

## 2015-12-24 NOTE — Patient Instructions (Signed)
Thank you for coming in today. Use keflex for UTI.  Take tylenol for pain.  Return as needed.

## 2015-12-26 LAB — URINE CULTURE

## 2015-12-28 ENCOUNTER — Other Ambulatory Visit: Payer: Self-pay | Admitting: Family Medicine

## 2016-02-04 ENCOUNTER — Other Ambulatory Visit: Payer: Self-pay | Admitting: Family Medicine

## 2016-02-12 ENCOUNTER — Encounter: Payer: Self-pay | Admitting: Family Medicine

## 2016-02-12 ENCOUNTER — Ambulatory Visit (INDEPENDENT_AMBULATORY_CARE_PROVIDER_SITE_OTHER): Payer: Medicare Other | Admitting: Family Medicine

## 2016-02-12 VITALS — BP 122/72 | HR 104 | Wt 147.0 lb

## 2016-02-12 DIAGNOSIS — F0390 Unspecified dementia without behavioral disturbance: Secondary | ICD-10-CM | POA: Diagnosis not present

## 2016-02-12 DIAGNOSIS — E119 Type 2 diabetes mellitus without complications: Secondary | ICD-10-CM | POA: Diagnosis not present

## 2016-02-12 DIAGNOSIS — R413 Other amnesia: Secondary | ICD-10-CM

## 2016-02-12 DIAGNOSIS — Z794 Long term (current) use of insulin: Secondary | ICD-10-CM

## 2016-02-12 DIAGNOSIS — E039 Hypothyroidism, unspecified: Secondary | ICD-10-CM | POA: Diagnosis not present

## 2016-02-12 LAB — POCT GLYCOSYLATED HEMOGLOBIN (HGB A1C): HEMOGLOBIN A1C: 8.2

## 2016-02-12 MED ORDER — MEMANTINE HCL 10 MG PO TABS: 10.0000 mg | ORAL_TABLET | Freq: Two times a day (BID) | ORAL | Status: DC

## 2016-02-12 MED ORDER — METFORMIN HCL 1000 MG PO TABS: ORAL_TABLET | ORAL | Status: DC

## 2016-02-12 NOTE — Progress Notes (Signed)
CC: Tasha Sheppard is a 81 y.o. female is here for Hyperglycemia and Dementia   Subjective: HPI:  Follow-up type 2 diabetes: Since starting on metformin family has noticed that her blood sugars are much better controlled. There was a random situation this morning where her fasting blood sugar was 300 otherwise it usually ranges between 120-160. She denies any diarrhea or side effects from metformin. She is also taking Lantus 10 units nightly. The biggest challenge for her is cutting back on cookies. Her husband recently finds empty cookie wrappers which she does not recall eating.  Follow-up dementia: Family members and husband have noticed that her short-term memory loss is worsening. For example she'll go to the mailbox and forget about it a few minutes later and think that she needs to go out to the mailbox again.  No situations of getting lost. She'll ask questions frequent throughout the day which have previously been answered. She is still independent in all ADLs  Follow-up hyperthyroidism: Taking with approximately 100% compliance. It's been a little more than 6 months since this was checked last in her dose was increased. She denies any unintentional weight loss or gain no skin or hair complaints.   Review Of Systems Outlined In HPI  Past Medical History  Diagnosis Date  . Hypertension   . Diabetes mellitus without complication (HCC)   . Thyroid disease   . Hyperlipidemia     No past surgical history on file. Family History  Problem Relation Age of Onset  . Diabetes Mother   . Stroke Mother   . Diabetes Brother   . Stroke Brother     Social History   Social History  . Marital Status: Married    Spouse Name: N/A  . Number of Children: N/A  . Years of Education: N/A   Occupational History  . Not on file.   Social History Main Topics  . Smoking status: Never Smoker   . Smokeless tobacco: Never Used  . Alcohol Use: No  . Drug Use: No  . Sexual Activity: Not on file    Other Topics Concern  . Not on file   Social History Narrative     Objective: BP 122/72 mmHg  Pulse 104  Wt 147 lb (66.679 kg)  General: Alert and Oriented, No Acute Distress HEENT: Pupils equal, round, reactive to light. Conjunctivae clear.  Moist mucous membranes Lungs: Clear to auscultation bilaterally, no wheezing/ronchi/rales.  Comfortable work of breathing. Good air movement. Cardiac: Regular rate and rhythm. Normal S1/S2.  No murmurs, rubs, nor gallops.   Extremities: No peripheral edema.  Strong peripheral pulses.  Mental Status: No depression, anxiety, nor agitation. Skin: Warm and dry.  Assessment & Plan: Tasha Sheppard was seen today for hyperglycemia and dementia.  Diagnoses and all orders for this visit:  Type 2 diabetes mellitus without complication, with long-term current use of insulin (HCC)  Memory loss  Dementia, without behavioral disturbance -     memantine (NAMENDA) 10 MG tablet; Take 1 tablet (10 mg total) by mouth 2 (two) times daily.  Hypothyroidism, unspecified hypothyroidism type -     TSH  Type 2 diabetes mellitus without complication, without long-term current use of insulin (HCC) -     metFORMIN (GLUCOPHAGE) 1000 MG tablet; One tablet by mouth twice a day for blood sugar control.   Type 2 diabetes: A1c of 8.2, uncontrolled chronic condition adding the morning dose of metformin no changes to her previous diabetic regimen Hyperthyroidism: Due for repeat TSH continue levothyroxine  pending results Memory loss: Worsening dementia, increasing dose of Namenda   Return in about 3 months (around 05/14/2016) for Sugar.

## 2016-02-12 NOTE — Addendum Note (Signed)
Addended by: Tasha Sheppard, Tasha Sheppard on: 02/12/2016 02:15 PM   Modules accepted: Orders

## 2016-02-13 ENCOUNTER — Telehealth: Payer: Self-pay | Admitting: Family Medicine

## 2016-02-13 LAB — TSH: TSH: 0.01 mIU/L — ABNORMAL LOW

## 2016-02-13 MED ORDER — LEVOTHYROXINE SODIUM 75 MCG PO TABS: 75.0000 ug | ORAL_TABLET | Freq: Every day | ORAL | Status: DC

## 2016-02-13 NOTE — Telephone Encounter (Signed)
Will you please let patient/family know that her thyroid supplement appears to be too high of a dose and I'd recommend it be decreased to daily. A new Rx was sent to [facility] in [place] and I would recommend f/u in three months to recheck this and blood sugar.

## 2016-02-13 NOTE — Telephone Encounter (Signed)
Husband notified of medication changes and recommendations

## 2016-02-18 ENCOUNTER — Other Ambulatory Visit: Payer: Self-pay | Admitting: Family Medicine

## 2016-03-02 ENCOUNTER — Other Ambulatory Visit: Payer: Self-pay | Admitting: Family Medicine

## 2016-03-06 ENCOUNTER — Other Ambulatory Visit: Payer: Self-pay

## 2016-03-06 DIAGNOSIS — Z794 Long term (current) use of insulin: Principal | ICD-10-CM

## 2016-03-06 DIAGNOSIS — E119 Type 2 diabetes mellitus without complications: Secondary | ICD-10-CM

## 2016-03-06 MED ORDER — INSULIN GLARGINE 100 UNIT/ML SOLOSTAR PEN: 10.0000 [IU] | PEN_INJECTOR | Freq: Every day | SUBCUTANEOUS | Status: DC

## 2016-03-10 ENCOUNTER — Other Ambulatory Visit: Payer: Self-pay

## 2016-03-10 DIAGNOSIS — Z794 Long term (current) use of insulin: Principal | ICD-10-CM

## 2016-03-10 DIAGNOSIS — E119 Type 2 diabetes mellitus without complications: Secondary | ICD-10-CM

## 2016-03-10 MED ORDER — INSULIN GLARGINE 100 UNIT/ML SOLOSTAR PEN: 30.0000 [IU] | PEN_INJECTOR | Freq: Every day | SUBCUTANEOUS | 99 refills | Status: DC

## 2016-03-26 ENCOUNTER — Other Ambulatory Visit: Payer: Self-pay | Admitting: Family Medicine

## 2016-03-28 ENCOUNTER — Ambulatory Visit (INDEPENDENT_AMBULATORY_CARE_PROVIDER_SITE_OTHER): Payer: Medicare Other | Admitting: Family Medicine

## 2016-03-28 ENCOUNTER — Encounter: Payer: Self-pay | Admitting: Family Medicine

## 2016-03-28 VITALS — BP 118/71 | HR 109 | Wt 140.0 lb

## 2016-03-28 DIAGNOSIS — R110 Nausea: Secondary | ICD-10-CM | POA: Diagnosis not present

## 2016-03-28 MED ORDER — METOCLOPRAMIDE HCL 10 MG PO TABS: 10.0000 mg | ORAL_TABLET | Freq: Three times a day (TID) | ORAL | 1 refills | Status: DC | PRN

## 2016-03-28 NOTE — Progress Notes (Signed)
CC: Tasha Sheppard is a 81 y.o. female is here for Nausea   Subjective: HPI:  Nausea for the last month. Daily, mild in severity, nothing makes it worse. Ginger ale helps.  Has very little appetitie for food other than sweets. Denies any abdominal pain, genitourinary complaints or any other gastrointestinal complaints. She doesn't think that this started after increasing Namenda. She denies fevers. She denies any constipation.   Review Of Systems Outlined In HPI  Past Medical History:  Diagnosis Date  . Diabetes mellitus without complication (HCC)   . Hyperlipidemia   . Hypertension   . Thyroid disease     No past surgical history on file. Family History  Problem Relation Age of Onset  . Diabetes Mother   . Stroke Mother   . Diabetes Brother   . Stroke Brother     Social History   Social History  . Marital status: Married    Spouse name: N/A  . Number of children: N/A  . Years of education: N/A   Occupational History  . Not on file.   Social History Main Topics  . Smoking status: Never Smoker  . Smokeless tobacco: Never Used  . Alcohol use No  . Drug use: No  . Sexual activity: Not on file   Other Topics Concern  . Not on file   Social History Narrative  . No narrative on file     Objective: BP 118/71   Pulse (!) 109   Wt 140 lb (63.5 kg)   BMI 20.98 kg/m   Vital signs reviewed. General: Alert and Oriented, No Acute Distress HEENT: Pupils equal, round, reactive to light. Conjunctivae clear.  External ears unremarkable.  Moist mucous membranes. Lungs: Clear and comfortable work of breathing, speaking in full sentences without accessory muscle use. Cardiac: Regular rate and rhythm.  Neuro: CN II-XII grossly intact, gait normal. Extremities: No peripheral edema.  Strong peripheral pulses.  Mental Status: No depression, anxiety, nor agitation. Logical though process. Skin: Warm and dry.  Assessment & Plan: Tasha Sheppard was seen today for nausea.  Diagnoses and  all orders for this visit:  Nausea without vomiting -     Lipase -     COMPLETE METABOLIC PANEL WITH GFR  Other orders -     metoCLOPramide (REGLAN) 10 MG tablet; Take 1 tablet (10 mg total) by mouth 3 (three) times daily as needed for nausea.  Rule out return of pancreatitis or metabolic abnormality. Switching from Zofran to Reglan   Return if symptoms worsen or fail to improve.

## 2016-03-29 LAB — COMPLETE METABOLIC PANEL WITH GFR
ALBUMIN: 4.1 g/dL (ref 3.6–5.1)
ALK PHOS: 50 U/L (ref 33–130)
ALT: 17 U/L (ref 6–29)
AST: 20 U/L (ref 10–35)
BUN: 8 mg/dL (ref 7–25)
CALCIUM: 8.4 mg/dL — AB (ref 8.6–10.4)
CO2: 23 mmol/L (ref 20–31)
Chloride: 101 mmol/L (ref 98–110)
Creat: 0.87 mg/dL (ref 0.60–0.93)
GFR, EST AFRICAN AMERICAN: 73 mL/min (ref >=60)
GFR, EST NON AFRICAN AMERICAN: 64 mL/min (ref >=60)
Glucose, Bld: 80 mg/dL (ref 65–99)
POTASSIUM: 3.9 mmol/L (ref 3.5–5.3)
Sodium: 143 mmol/L (ref 135–146)
Total Bilirubin: 0.3 mg/dL (ref 0.2–1.2)
Total Protein: 6.3 g/dL (ref 6.1–8.1)

## 2016-03-29 LAB — LIPASE: Lipase: 33 U/L (ref 7–60)

## 2016-05-14 ENCOUNTER — Ambulatory Visit (INDEPENDENT_AMBULATORY_CARE_PROVIDER_SITE_OTHER): Payer: Medicare Other | Admitting: Osteopathic Medicine

## 2016-05-14 VITALS — BP 131/69 | HR 104 | Ht 68.5 in | Wt 133.0 lb

## 2016-05-14 DIAGNOSIS — E039 Hypothyroidism, unspecified: Secondary | ICD-10-CM | POA: Diagnosis not present

## 2016-05-14 DIAGNOSIS — M899 Disorder of bone, unspecified: Secondary | ICD-10-CM | POA: Diagnosis not present

## 2016-05-14 DIAGNOSIS — F0390 Unspecified dementia without behavioral disturbance: Secondary | ICD-10-CM | POA: Diagnosis not present

## 2016-05-14 DIAGNOSIS — E119 Type 2 diabetes mellitus without complications: Secondary | ICD-10-CM | POA: Diagnosis not present

## 2016-05-14 DIAGNOSIS — M810 Age-related osteoporosis without current pathological fracture: Secondary | ICD-10-CM

## 2016-05-14 LAB — POCT GLYCOSYLATED HEMOGLOBIN (HGB A1C): Hemoglobin A1C: 6.6

## 2016-05-14 NOTE — Progress Notes (Signed)
HPI: Tasha Sheppard is a 81 y.o. female  who presents to [facility] [place] today, 05/14/16,  for chief complaint of:  Chief Complaint  Patient presents with  . Establish Care    Diabetes    Daughter spoke with me privately prior to the visit. There is concern for worsening memory problems with this patient. Her husband is keeping control of most of her medications, keeping these and a small lockbox because she will take multiple doses per day.  Speaking with the family all together, they confirm this story. Patient also reports feeling significant fatigue, some depression symptoms/lack of interest.  On review of medications, they bring pill bottles with them, the patient appears to be taking both 75 g +100 g of levothyroxine despite TSH at last check being quite low. This seems to been a miscommunication regarding the dosing of this medicine.   With regard to diabetes, A1c is under good control, they're checking her sugars at somewhat random, no consistent record of fasting blood sugars, they seem to be checking these in midday and husband is adjusting insulin based on her numbers. She did have an episode of hypoglycemia a few months ago where her sugar was down into the 30s per the husband's report.  Patient and her family are concerned about memory loss, she has good long-term memory but has trouble remembering things short-term. Trouble remembering new information. No drastic change over the past few months but over time they're noticing a difference.  Daughter also asks if I can write a letter stating that the patient has a emotional support animal, her dog, she gets very upset when she is separated from this animal  Patient is accompanied by daughter and husband who assists with history-taking.   Past medical, surgical, social and family history reviewed: Past Medical History:  Diagnosis Date  . Diabetes mellitus without complication (HCC)   .  Hyperlipidemia   . Hypertension   . Thyroid disease    No past surgical history on file. Social History  Substance Use Topics  . Smoking status: Never Smoker  . Smokeless tobacco: Never Used  . Alcohol use No   Family History  Problem Relation Age of Onset  . Diabetes Mother   . Stroke Mother   . Diabetes Brother   . Stroke Brother      Current medication list and allergy/intolerance information reviewed:   Current Outpatient Prescriptions  Medication Sig Dispense Refill  . alendronate (FOSAMAX) 70 MG tablet Take 1 tablet (70 mg total) by mouth once a week. Take with a full glass of water on an empty stomach. 12 tablet 11  . aspirin EC 81 MG tablet Take 1 tablet (81 mg total) by mouth daily.    . diclofenac (VOLTAREN) 50 MG EC tablet Take one tablet every 8 hours only as needed for pain, take with small snack. 45 tablet 0  . Insulin Glargine (LANTUS SOLOSTAR) 100 UNIT/ML Solostar Pen Inject 30 Units into the skin daily at 10 pm. 15 pen PRN  . Insulin Pen Needle (B-D UF III MINI PEN NEEDLES) 31G X 5 MM MISC Use a new needle daily to inject insulin subcutaneously.  Dx: Type 2 Diabetes. 50 each 11  . levothyroxine (SYNTHROID, LEVOTHROID) 75 MCG tablet Take 1 tablet (75 mcg total) by mouth daily. Discontinue formulation. 90 tablet 1  . memantine (NAMENDA) 10 MG tablet Take 1 tablet (10 mg total) by mouth 2 (two) times daily. 60 tablet 2  .  metFORMIN (GLUCOPHAGE) 1000 MG tablet One tablet by mouth twice a day for blood sugar control. 60 tablet 2  . metoCLOPramide (REGLAN) 10 MG tablet Take 1 tablet (10 mg total) by mouth 3 (three) times daily as needed for nausea. 60 tablet 1  . ondansetron (ZOFRAN) 4 MG tablet TAKE ONE TO TWO TABLETS BY MOUTH EVERY 8 HOURS AS NEEDED FOR NAUSEA AND VOMITING 30 tablet 0  . rosuvastatin (CRESTOR) 20 MG tablet Take 20 mg by mouth daily.     No current facility-administered medications for this visit.    Allergies  Allergen Reactions  . Aricept  [Donepezil Hcl]     Possibly causing worsened nausea  . Influenza Vaccines     "bad reaction"  . Pioglitazone     confusion  . Trazodone And Nefazodone     Confusion, anxiety      Review of Systems:  Constitutional:  +unintentional weight changes and low appetite. +significant fatigue.   Cardiac: No  chest pain  Respiratory:  No  shortness of breath. No  Cough  Gastrointestinal: No  abdominal pain, No  nausea, No  vomiting,  No  blood in stool  Endocrine: No cold intolerance,  No heat intolerance. No polyuria/polydipsia/polyphagia   Neurologic: No  weakness, No  dizziness,  Psychiatric: +concerns with depression, No  concerns with anxiety, No sleep problems, No mood problems, no memory problems  Exam:  BP 131/69   Pulse (!) 104   Ht 5' 8.5" (1.74 m)   Wt 133 lb (60.3 kg)   BMI 19.93 kg/m   Constitutional: VS see above. General Appearance: alert, well-developed, well-nourished, NAD  Eyes: Normal lids and conjunctive, non-icteric sclera  Ears, Nose, Mouth, Throat: MMM, Normal external inspection ears/nares/mouth/lips/gums. TM normal bilaterally. Pharynx/tonsils no erythema, no exudate. Nasal mucosa normal.   Neck: No masses, trachea midline. No thyroid enlargement. No tenderness/mass appreciated. No lymphadenopathy  Respiratory: Normal respiratory effort. no wheeze, no rhonchi, no rales  Cardiovascular: S1/S2 normal, no murmur, no rub/gallop auscultated. RRR. No lower extremity edema.  Gastrointestinal: Nontender, no masses. No hepatomegaly, no splenomegaly. No hernia appreciated. Bowel sounds normal. Rectal exam deferred.   Musculoskeletal: Gait normal. No clubbing/cyanosis of digits.   Neurological:  Normal balance/coordination. No tremor.   Skin: warm, dry, intact. No rash/ulcer. No concerning nevi or subq nodules on limited exam.    Psychiatric: Normal judgment/insight. Flat mood and affect.   Results for orders placed or performed in visit on 05/14/16  (from the past 72 hour(s))  POCT HgB A1C     Status: None   Collection Time: 05/14/16  2:01 PM  Result Value Ref Range   Hemoglobin A1C 6.6      ASSESSMENT/PLAN:   Type 2 diabetes mellitus without complication, without long-term current use of insulin (HCC) - Plan: POCT HgB A1C, CBC with Differential/Platelet, COMPLETE METABOLIC PANEL WITH GFR, Lipid panel  Hypothyroidism, unspecified hypothyroidism type - Plan: TSH  Dementia, without behavioral disturbance - Plan: CBC with Differential/Platelet, COMPLETE METABOLIC PANEL WITH GFR, Vitamin B12, Folate, Sedimentation rate  Bone disease - Plan: VITAMIN D 25 Hydroxy (Vit-D Deficiency, Fractures)  Osteoporosis   Patient Instructions  Plan for Diabetes   STOP Insulin.   Continue Metformin.   Plan for checking sugars only fasting levels in the morning. Please write these down and bring them with you to every office visit  Recheck A1C in three months  Plan for memory:  Get blood work to see if any secondary cause such as abnormal thyroid.  I expect that the thyroid will be an issue.  May need to adjust medications, particularly for thyroid. If we do need to adjust medicines, let's give this some time to see if it makes a difference in the memory  If memory is still problem, we'll need to return to the office for further discussion and formal cognitive testing with Dr. Lyn HollingsheadAlexander, when he schedule this visit, please make sure to tell the scheduler that this is a 30 minute appointment         Visit summary with medication list and pertinent instructions was printed for patient to review. All questions at time of visit were answered - patient instructed to contact office with any additional concerns. ER/RTC precautions were reviewed with the patient. Follow-up plan: Return in about 3 months (around 08/13/2016) for Follow-up for diabetes. We'll need separate appointment for memory testing, if needed. .  Note: Total time spent 45  minutes, greater than 50% of the visit was spent face-to-face counseling and coordinating care for the following: The primary encounter diagnosis was Type 2 diabetes mellitus without complication, without long-term current use of insulin (HCC). Diagnoses of Hypothyroidism, unspecified hypothyroidism type, Dementia, without behavioral disturbance, Bone disease, and Osteoporosis were also pertinent to this visit..Marland Kitchen

## 2016-05-14 NOTE — Patient Instructions (Addendum)
Plan for Diabetes   STOP Insulin.   Continue Metformin.   Plan for checking sugars only fasting levels in the morning. Please write these down and bring them with you to every office visit  Recheck A1C in three months  Plan for memory:  Get blood work to see if any secondary cause such as abnormal thyroid. I expect that the thyroid will be an issue.  May need to adjust medications, particularly for thyroid. If we do need to adjust medicines, let's give this some time to see if it makes a difference in the memory  If memory is still problem, we'll need to return to the office for further discussion and formal cognitive testing with Dr. Lyn HollingsheadAlexander, when he schedule this visit, please make sure to tell the scheduler that this is a 30 minute appointment

## 2016-05-15 LAB — COMPLETE METABOLIC PANEL WITH GFR
ALBUMIN: 4 g/dL (ref 3.6–5.1)
ALK PHOS: 49 U/L (ref 33–130)
ALT: 16 U/L (ref 6–29)
AST: 20 U/L (ref 10–35)
BILIRUBIN TOTAL: 0.4 mg/dL (ref 0.2–1.2)
BUN: 8 mg/dL (ref 7–25)
CALCIUM: 9.1 mg/dL (ref 8.6–10.4)
CO2: 28 mmol/L (ref 20–31)
Chloride: 101 mmol/L (ref 98–110)
Creat: 0.63 mg/dL (ref 0.60–0.93)
GFR, EST NON AFRICAN AMERICAN: 86 mL/min (ref >=60)
GFR, Est African American: >89 mL/min (ref >=60)
GLUCOSE: 98 mg/dL (ref 65–99)
POTASSIUM: 4 mmol/L (ref 3.5–5.3)
SODIUM: 141 mmol/L (ref 135–146)
TOTAL PROTEIN: 6.1 g/dL (ref 6.1–8.1)

## 2016-05-15 LAB — TSH: TSH: 0.01 m[IU]/L — AB

## 2016-05-15 LAB — CBC WITH DIFFERENTIAL/PLATELET
BASOS ABS: 0 {cells}/uL (ref 0–200)
Basophils Relative: 0 %
EOS PCT: 1 %
Eosinophils Absolute: 88 cells/uL (ref 15–500)
HEMATOCRIT: 37.6 % (ref 35.0–45.0)
HEMOGLOBIN: 11.8 g/dL (ref 11.7–15.5)
LYMPHS ABS: 2376 {cells}/uL (ref 850–3900)
Lymphocytes Relative: 27 %
MCH: 27.6 pg (ref 27.0–33.0)
MCHC: 31.4 g/dL — AB (ref 32.0–36.0)
MCV: 88.1 fL (ref 80.0–100.0)
MONO ABS: 792 {cells}/uL (ref 200–950)
MPV: 10.3 fL (ref 7.5–12.5)
Monocytes Relative: 9 %
NEUTROS ABS: 5544 {cells}/uL (ref 1500–7800)
NEUTROS PCT: 63 %
Platelets: 285 10*3/uL (ref 140–400)
RBC: 4.27 MIL/uL (ref 3.80–5.10)
RDW: 14.1 % (ref 11.0–15.0)
WBC: 8.8 10*3/uL (ref 3.8–10.8)

## 2016-05-15 LAB — FOLATE: Folate: 6.7 ng/mL (ref >5.4)

## 2016-05-15 LAB — LIPID PANEL
CHOL/HDL RATIO: 2.3 ratio (ref <=5.0)
Cholesterol: 111 mg/dL — ABNORMAL LOW (ref 125–200)
HDL: 48 mg/dL (ref >=46)
LDL Cholesterol: 38 mg/dL (ref <130)
TRIGLYCERIDES: 123 mg/dL (ref <150)
VLDL: 25 mg/dL (ref <30)

## 2016-05-15 LAB — SEDIMENTATION RATE: Sed Rate: 1 mm/hr (ref 0–30)

## 2016-05-15 LAB — VITAMIN D 25 HYDROXY (VIT D DEFICIENCY, FRACTURES): VIT D 25 HYDROXY: 29 ng/mL — AB (ref 30–100)

## 2016-05-21 ENCOUNTER — Ambulatory Visit: Payer: Medicare Other | Admitting: Osteopathic Medicine

## 2016-05-22 ENCOUNTER — Other Ambulatory Visit: Payer: Self-pay | Admitting: Family Medicine

## 2016-05-22 DIAGNOSIS — F0390 Unspecified dementia without behavioral disturbance: Secondary | ICD-10-CM

## 2016-05-22 DIAGNOSIS — E119 Type 2 diabetes mellitus without complications: Secondary | ICD-10-CM

## 2016-05-26 ENCOUNTER — Ambulatory Visit (INDEPENDENT_AMBULATORY_CARE_PROVIDER_SITE_OTHER): Payer: Medicare Other | Admitting: Osteopathic Medicine

## 2016-05-26 ENCOUNTER — Encounter: Payer: Self-pay | Admitting: Osteopathic Medicine

## 2016-05-26 VITALS — BP 132/63 | HR 108 | Ht 68.5 in | Wt 135.0 lb

## 2016-05-26 DIAGNOSIS — R4189 Other symptoms and signs involving cognitive functions and awareness: Secondary | ICD-10-CM

## 2016-05-26 DIAGNOSIS — F0390 Unspecified dementia without behavioral disturbance: Secondary | ICD-10-CM

## 2016-05-26 NOTE — Patient Instructions (Addendum)
Cognitive testing is concerning for dementia. Your brain has some problems retaining and remembering new information, paying attention to new information, recognizing patterns, and executing fine motor skills and planning.   Overall though, you are doing pretty well, we should repeat this test in about 6 months. You are already on a medication to help preserve brain function. I recommend regular exercise and lots of social interaction with your family.

## 2016-05-27 DIAGNOSIS — F0390 Unspecified dementia without behavioral disturbance: Secondary | ICD-10-CM | POA: Insufficient documentation

## 2016-05-27 NOTE — Assessment & Plan Note (Signed)
[facility] 05/26/2016 13/30, more likely Alzheimer's or vascular dementia

## 2016-05-27 NOTE — Progress Notes (Signed)
HPI: Tasha Sheppard is a 81 y.o. female  who presents to [facility] today, 05/27/16,  for chief complaint of:  Chief Complaint  Patient presents with  . Follow-up    to discuss memory test    Patient and family are concerned about memory loss, declining cognitive function. Mitral cognitive assessment test was performed today. Patient completed some college.  Past medical, surgical, social and family history reviewed: Past Medical History:  Diagnosis Date  . Diabetes mellitus without complication (HCC)   . Hyperlipidemia   . Hypertension   . Thyroid disease    No past surgical history on file. Social History  Substance Use Topics  . Smoking status: Never Smoker  . Smokeless tobacco: Never Used  . Alcohol use No   Family History  Problem Relation Age of Onset  . Diabetes Mother   . Stroke Mother   . Diabetes Brother   . Stroke Brother      Current medication list and allergy/intolerance information reviewed:   Current Outpatient Prescriptions  Medication Sig Dispense Refill  . alendronate (FOSAMAX) 70 MG tablet Take 1 tablet (70 mg total) by mouth once a week. Take with a full glass of water on an empty stomach. 12 tablet 11  . aspirin EC 81 MG tablet Take 1 tablet (81 mg total) by mouth daily.    . diclofenac (VOLTAREN) 50 MG EC tablet Take one tablet every 8 hours only as needed for pain, take with small snack. 45 tablet 0  . levothyroxine (SYNTHROID, LEVOTHROID) 75 MCG tablet Take 1 tablet (75 mcg total) by mouth daily. Discontinue <MEASUREMENTRhoMarland KitcheKentuc210 [id number]-[place]14[place]for nRhoMarland Kitc[phone]-[place]70[place]for nRhoMarland KitcheKentuc858-23935w1d7ZOXAmbHoh351-838-[place]36[place]for nRhoMarland KitcheKentuc989 5872w1d3ZOX574 [address] [place]05Mayfor nRhoMarland KitcheKentuc564-76838w1d6ZOXAmbWoLuJoylene DraGranvillSeUniversi506 [address] [place]92Mayfor nRhoMarland [phone] 03225w1d0ZOXAm872-732-[place]63Mayfor nRhoMarland KitcheKentuc646-65524w1d9Z917-274-[place]92Mayfor nRhoMarland KitcheKentuc828 [phone]-[place]81Mayfor nRhoMarland KitcheKentuc847-75763w1d6ZOXAmbHo571037[place]36Mayfor nRhoMarland KitcheKentuc337302w1d8ZOXAmbCLuJ([phone] 165-[place]98[place]for nRhoMarland KitcheKentuc([phone] [phone]-[place]50Mayfor nRhoMarland KitcheKentuc249-61970w1d2ZOXAmbOLuJoylene DraGr669679[place]45Mayfor nRhoMarland KitcheKentuc919-07654w1d9ZOXAmbBLuJoylene [phone]-[place]81Mayfor nRhoMarland [phone])32125w1d9ZOXA410438[place]95Mayfor nRhoMarland KitcheKentuc661-80531w1d1445-777-[place]52Mayfor nRhoMarland KitcheKentuc901-76643w1d4ZOXAmbELuJoy340-859-[place]98Mayfor nRhoMarland KitcheKentuc(863) 383102w1d0ZOXAmbTwin LuJoy952-569-[place]58Mayfor nRhoMarland KitcheKentuc380-27340w1d9ZOXAmbLindeLuJoylene [phone]-[place]67Mayfor nRhoMarland KitcheKentuc([phone])76840w1d2ZOXAmbALuJoylene DraGranvillSeCarolinas Physicians Network Inc([phone] 491-[place]55Mayfor nRhoMarland KitcheKentuc229-26324w1d5ZOXAmbPr309-498-[place]01Mayfor nRhoMarland KitcheKentuc726 24720w1d8ZOXA732-413-[place]70[place]for nRhoMarland KitcheKentuc937-12491w1d5ZOXA862-873-[place]92Mayfor nRhoMarland KitcheKentuc603-41559w1d4ZO270-373-[place]82Mayfor nRhoMarland KitcheKentuc548-72741w1d1ZOXAmbDownLuJoylene Dr858 [address] [place]24Mayfor<MEASUREME([phone])655-[place]52261001JoylenSeBrainerd Lakes [facility]) 10 MG tablet TAKE ONE TABLET BY MOUTH TWICE DAILY 60 tablet 2  . metFORMIN (GLUCOPHAGE) 1000 MG tablet TAKE ONE TABLET BY MOUTH TWICE DAILY FOR  BLOOD  SUGAR  CONTROL 60 tablet 2  . rosuvastatin (CRESTOR) 20 MG tablet Take 20 mg by mouth daily.     No current facility-administered medications for this visit.    Allergies  Allergen Reactions  . Aricept  [Donepezil Hcl]     Possibly causing worsened nausea  . Influenza Vaccines     "bad reaction"  . Pioglitazone     confusion  . Trazodone And Nefazodone     Confusion, anxiety      Review of Systems:  Constitutional:  No  fever, no chills, No recent illness   Exam:  BP 132/63   Pulse (!) 108   Ht 5' 8.5" (1.74 m)   Wt 135 lb (61.2 kg)   BMI 20.23 kg/m   Constitutional: VS see above. General Appearance: alert, well-developed, well-nourished, NAD  Psychiatric: Fair judgment/insight. Normal mood and affect. Oriented x person, place, time.      ASSESSMENT/PLAN: Montreal cognitive assessment scoring 13 out of 30 with significant deficits in visuospatial/executive functions, memory, attention. Results discussed with the patient, she is already taking Namenda, had poor tolerance of Aricept.  Cognitive decline  Dementia without behavioral disturbance, unspecified dementia type - Plan: memantine (NAMENDA) 10 MG tablet   Patient Instructions  Cognitive testing is concerning for dementia. Your brain has some problems retaining and remembering new information, paying attention to new information, recognizing patterns, and executing fine motor skills and planning.   Overall though, you are doing pretty well, we should repeat this test in about 6 months. You are already on a medication to help preserve brain function. I recommend regular exercise and lots of social  interaction with your family.     Visit summary with medication list and pertinent instructions was printed for patient to review. All questions at time of visit were answered - patient instructed to contact office with any additional concerns. ER/RTC precautions were reviewed with the patient. Follow-up plan: Return in about 6 months (around 11/24/2016) for REPEAT COGNITIVE TESTING (OV30) sooner if needed.  Note: Total time spent 40 minutes, greater than 50% of the visit was spent face-to-face counseling and coordinating care  for the following: The primary encounter diagnosis was Cognitive decline. A diagnosis of Dementia without behavioral disturbance, unspecified dementia type was also pertinent to this visit..Marland Kitchen

## 2016-06-02 ENCOUNTER — Ambulatory Visit: Payer: Medicare Other | Admitting: Osteopathic Medicine

## 2016-06-02 ENCOUNTER — Other Ambulatory Visit: Payer: Self-pay

## 2016-06-02 MED ORDER — LEVOTHYROXINE SODIUM 75 MCG PO TABS: 75.0000 ug | ORAL_TABLET | Freq: Every day | ORAL | 0 refills | Status: DC

## 2016-06-30 ENCOUNTER — Telehealth: Payer: Self-pay | Admitting: *Deleted

## 2016-06-30 NOTE — Telephone Encounter (Signed)
Claude (?) called and left a messge stating he needed Dr. Lyn HollingsheadAlexander to call the pharmacy and call "this medicine in" but didn't state which medicine it was. He left a phone # [phone]. Not sure if this is the pharm #. On the voicemail he states of he wasn't called back before 12 then don't bother. Called back and no answer

## 2016-08-13 ENCOUNTER — Encounter: Payer: Self-pay | Admitting: Osteopathic Medicine

## 2016-08-13 ENCOUNTER — Ambulatory Visit (INDEPENDENT_AMBULATORY_CARE_PROVIDER_SITE_OTHER): Payer: Medicare Other | Admitting: Osteopathic Medicine

## 2016-08-13 VITALS — BP 128/71 | HR 102 | Ht 69.0 in | Wt 129.0 lb

## 2016-08-13 DIAGNOSIS — E119 Type 2 diabetes mellitus without complications: Secondary | ICD-10-CM | POA: Diagnosis not present

## 2016-08-13 DIAGNOSIS — F0390 Unspecified dementia without behavioral disturbance: Secondary | ICD-10-CM | POA: Diagnosis not present

## 2016-08-13 DIAGNOSIS — E039 Hypothyroidism, unspecified: Secondary | ICD-10-CM | POA: Diagnosis not present

## 2016-08-13 MED ORDER — METFORMIN HCL 1000 MG PO TABS: ORAL_TABLET | ORAL | 2 refills | Status: DC

## 2016-08-13 MED ORDER — INSULIN GLARGINE 100 UNIT/ML SOLOSTAR PEN: 10.0000 [IU] | PEN_INJECTOR | Freq: Every day | SUBCUTANEOUS | 99 refills | Status: DC

## 2016-08-13 NOTE — Progress Notes (Signed)
HPI: Tasha Sheppard is a 81 y.o. female  who [facility] [place] today, 08/13/16,  for chief complaint of:  Chief Complaint  Patient presents with  . Follow-up    DIATBETES    Cognitive impairment: Stable. Patient is currently on Namenda. Previous cognitive testing revealed significant deficits consistent with dementia. Husband reports low appetite, poor memory, poor attention. Patient is not exhibiting concerning psychiatric behaviors such as agitation or delirium, she is not exhibiting dangerous behaviors such as wandering, leading hot stove on or iron, etc.  Diabetes: At last visit, A1c was 6.6, husband had been adjusting her insulin dose "based on her sugars" but he couldn't really tell me what criteria he was using to raise or lower the dose and she was experiencing some hypoglycemic episodes. At that point, since she was on a fairly low insulin dose anyway, we decided to stop the insulin  to avoid dangerous hypoglycemic episodes and to hopefully improve energy/cognitive function if low sugar with the reason behind this. Unfortunately, A1c is increased today to greater than 10  Hypothyroid: need dose adjustment, previously was taking 75 mcg plus 100 mcg pills   Patient is accompanied by husband who assists with history-taking.   Past medical, surgical, social and family history reviewed: Patient Active Problem List   Diagnosis Date Noted  . Dementia without behavioral disturbance 05/27/2016  . Acute cystitis without hematuria 12/24/2015  . Knee pain, bilateral 11/22/2015  . Degeneration of cervical intervertebral disc 08/09/2015  . Elevated lipase 07/31/2015  . Type 2 diabetes mellitus (HCC) 07/17/2015  . Osteoporosis 07/17/2015  . Hypothyroidism 07/17/2015  . Essential hypertension, benign 07/17/2015  . Memory loss 07/17/2015  . 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 Arthritis, senescent 07/17/2015   No past surgical history on  file. Social History  Substance Use Topics  . Smoking status: Never Smoker  . Smokeless tobacco: Never Used  . Alcohol use No   Family History  Problem Relation Age of Onset  . Diabetes Mother   . Stroke Mother   . Diabetes Brother   . Stroke Brother      Current medication list and allergy/intolerance information reviewed:   Current Outpatient Prescriptions on File Prior to Visit  Medication Sig Dispense Refill  . alendronate (FOSAMAX) 70 MG tablet Take 1 tablet (70 mg total) by mouth once a week. Take with a full glass of water on an empty stomach. 12 tablet 11  . aspirin EC 81 MG tablet Take 1 tablet (81 mg total) by mouth daily.    . diclofenac (VOLTAREN) 50 MG EC tablet Take one tablet every 8 hours only as needed for pain, take with small snack. 45 tablet 0  . levothyroxine (SYNTHROID, LEVOTHROID) 75 MCG tablet Take 1 tablet (75 mcg total) by mouth daily. Recheck TSH in 2 months. 90 tablet 0  . memantine (NAMENDA) 10 MG tablet TAKE ONE TABLET BY MOUTH TWICE DAILY 60 tablet 2  . metFORMIN (GLUCOPHAGE) 1000 MG tablet TAKE ONE TABLET BY MOUTH TWICE DAILY FOR  BLOOD  SUGAR  CONTROL 60 tablet 2  . rosuvastatin (CRESTOR) 20 MG tablet Take 20 mg by mouth daily.     No current facility-administered medications on file prior to visit.    Allergies  Allergen Reactions  . Aricept [Donepezil Hcl]     Possibly causing worsened nausea  . Influenza Vaccines     "bad reaction"  . Pioglitazone     confusion  . Trazodone And  Nefazodone     Confusion, anxiety      Review of Systems:  Constitutional: No recent illness  Cardiac: No  chest pain  Respiratory:  No  shortness of breath.  Gastrointestinal: No  abdominal pain  Skin: No  Rash  Neurologic: No  weakness, No  Dizziness   Exam:  BP 128/71   Pulse (!) 102   Ht 5\' 9"  (1.753 m)   Wt 129 lb (58.5 kg)   BMI 19.05 kg/m   Constitutional: VS see above. General Appearance: alert, well-developed, well-nourished,  NAD  Eyes: Normal lids and conjunctive, non-icteric sclera  Ears, Nose, Mouth, Throat: MMM, Normal external inspection ears/nares/mouth/lips/gums.  Neck: No masses, trachea midline.   Respiratory: Normal respiratory effort. no wheeze, no rhonchi, no rales  Cardiovascular: S1/S2 normal, no murmur, no rub/gallop auscultated. RRR.   Musculoskeletal: Gait normal. Symmetric and independent movement of all extremities  Neurological: Normal balance/coordination. No tremor.  Skin: warm, dry, intact.   Psychiatric: Normal judgment/insight. Normal mood and affect. Oriented x3.    Results for orders placed or performed in visit on 08/13/16 (from the past 72 hour(s))  TSH     Status: Abnormal   Collection Time: 08/13/16  3:09 PM  Result Value Ref Range   TSH 7.39 (H) mIU/L    Comment:   Reference Range   > or = 20 Years  0.40-4.50   Pregnancy Range First trimester  0.26-2.66 Second trimester 0.55-2.73 Third trimester  0.43-2.91     POCT HgB A1C     Status: None   Collection Time: 08/15/16 11:52 AM  Result Value Ref Range   Hemoglobin A1C 10.6       ASSESSMENT/PLAN:   Instructions for insulin dose and to control A1c and avoid hypoglycemia. See patient instructions which were printed and reviewed with the patient's husband who is the main person managing her medications.  TSH demonstrates need for thyroid adjustment, we'll call in alternate dose  Dementia is stable  Type 2 diabetes mellitus without complication, without long-term current use of insulin (HCC) - Plan: POCT HgB A1C, metFORMIN (GLUCOPHAGE) 1000 MG tablet  Hypothyroidism, unspecified type - Plan: TSH  Dementia without behavioral disturbance, unspecified dementia type    Patient Instructions  Plan:  Diabetes: We stopped the insulin since there was some concern for irregular dosing, low blood sugars down into the 30s, and memory problems. However, sugars are pretty high since the insulin has been stopped.  Restart insulin at 10 units once per day, continue the metformin. Do not adjust the dose of insulin without talking to Dr. Lyn HollingsheadAlexander first. Please measure fasting sugars once per day in the morning, and any other time you might be feeling ill or weak. Fasting blood sugars should be about 80-120. If your sugars are dropping too low, stop the insulin and please call the office. Please bring a record of her home blood sugars to every doctor visit.  Thyroid: We are due for rechecking this lab, we'll call you once we have these results.    Visit summary with medication list and pertinent instructions was printed for patient to review. All questions at time of visit were answered - patient instructed to contact office with any additional concerns. ER/RTC precautions were reviewed with the patient. Follow-up plan: Return in about 3 months (around 11/11/2016) for DIABETES FOLLOWUP.

## 2016-08-13 NOTE — Patient Instructions (Addendum)
Plan:  Diabetes: We stopped the insulin since there was some concern for irregular dosing, low blood sugars down into the 30s, and memory problems. However, sugars are pretty high since the insulin has been stopped. Restart insulin at 10 units once per day, continue the metformin. Do not adjust the dose of insulin without talking to Dr. Lyn HollingsheadAlexander first. Please measure fasting sugars once per day in the morning, and any other time you might be feeling ill or weak. Fasting blood sugars should be about 80-120. If your sugars are dropping too low, stop the insulin and please call the office. Please bring a record of her home blood sugars to every doctor visit.  Thyroid: We are due for rechecking this lab, we'll call you once we have these results.

## 2016-08-14 LAB — TSH: TSH: 7.39 m[IU]/L — AB

## 2016-08-14 MED ORDER — LEVOTHYROXINE SODIUM 88 MCG PO TABS: 88.0000 ug | ORAL_TABLET | Freq: Every day | ORAL | 0 refills | Status: DC

## 2016-08-15 LAB — POCT GLYCOSYLATED HEMOGLOBIN (HGB A1C): HEMOGLOBIN A1C: 10.6

## 2016-08-25 ENCOUNTER — Encounter: Payer: Self-pay | Admitting: Sports Medicine

## 2016-08-25 ENCOUNTER — Ambulatory Visit (INDEPENDENT_AMBULATORY_CARE_PROVIDER_SITE_OTHER): Payer: Medicare Other | Admitting: Sports Medicine

## 2016-08-25 DIAGNOSIS — M1712 Unilateral primary osteoarthritis, left knee: Secondary | ICD-10-CM | POA: Diagnosis not present

## 2016-08-25 NOTE — Assessment & Plan Note (Signed)
Moderate response to previous steroid injection, x-ray confirmed. Failed oral medications. Steroid injection as well as Orthovisc, return in one week for Orthovisc injection #2 of 4 into the left knee.

## 2016-08-25 NOTE — Progress Notes (Signed)
   Subjective:    I'm seeing this patient as a consultation for:   Dr. Sunnie NielsenNatalie Sheppard  CC: Left knee pain  HPI: This is a pleasant 82 year old female, she has left knee osteoarthritis, x-ray confirmed, had an injection several months ago provided some relief, since then NSAIDs and analgesics have not been effective. She is agreeable to proceed with another injection and viscous supplementation.  Past medical history:  Negative.  See flowsheet/record as well for more information.  Surgical history: Negative.  See flowsheet/record as well for more information.  Family history: Negative.  See flowsheet/record as well for more information.  Social history: Negative.  See flowsheet/record as well for more information.  Allergies, and medications have been entered into the medical record, reviewed, and no changes needed.   Review of Systems: No headache, visual changes, nausea, vomiting, diarrhea, constipation, dizziness, abdominal pain, skin rash, fevers, chills, night sweats, weight loss, swollen lymph nodes, body aches, joint swelling, muscle aches, chest pain, shortness of breath, mood changes, visual or auditory hallucinations.   Objective:   General: Well Developed, well nourished, and in no acute distress.  Neuro/Psych: Alert and oriented x3, extra-ocular muscles intact, able to move all 4 extremities, sensation grossly intact. Skin: Warm and dry, no rashes noted.  Respiratory: Not using accessory muscles, speaking in full sentences, trachea midline.  Cardiovascular: Pulses palpable, no extremity edema. Abdomen: Does not appear distended. Left Knee: Normal to inspection with no erythema or effusion or obvious bony abnormalities. Tender to palpation at the medial joint line as well as at the patellar facets ROM normal in flexion and extension and lower leg rotation. Ligaments with solid consistent endpoints including ACL, PCL, LCL, MCL. Negative Mcmurray's and provocative meniscal  tests. Non painful patellar compression. Patellar and quadriceps tendons unremarkable. Hamstring and quadriceps strength is normal.  Procedure: Real-time Ultrasound Guided Injection of left knee Device: GE Logiq E  Verbal informed consent obtained.  Time-out conducted.  Noted no overlying erythema, induration, or other signs of local infection.  Skin prepped in a sterile fashion.  Local anesthesia: Topical Ethyl chloride.  With sterile technique and under real time ultrasound guidance:  1 mL Kenalog 40, 2 mL lidocaine, 2 mL Marcaine injected easily, syringe switched and 30 mg/2 mL of OrthoVisc (sodium hyaluronate) in a prefilled syringe was injected easily into the knee through a 22-gauge needle. Completed without difficulty  Pain immediately resolved suggesting accurate placement of the medication.  Advised to call if fevers/chills, erythema, induration, drainage, or persistent bleeding.  Images permanently stored and available for review in the ultrasound unit.  Impression: Technically successful ultrasound guided injection.  Impression and Recommendations:   This case required medical decision making of moderate complexity.  Primary osteoarthritis of left knee Moderate response to previous steroid injection, x-ray confirmed. Failed oral medications. Steroid injection as well as Orthovisc, return in one week for Orthovisc injection #2 of 4 into the left knee.

## 2016-08-27 ENCOUNTER — Other Ambulatory Visit: Payer: Self-pay | Admitting: Osteopathic Medicine

## 2016-08-27 DIAGNOSIS — F0390 Unspecified dementia without behavioral disturbance: Secondary | ICD-10-CM

## 2016-08-28 DIAGNOSIS — S72142A Displaced intertrochanteric fracture of left femur, initial encounter for closed fracture: Secondary | ICD-10-CM | POA: Insufficient documentation

## 2016-09-01 ENCOUNTER — Ambulatory Visit: Payer: Medicare Other | Admitting: Sports Medicine

## 2016-09-17 LAB — LIPID PANEL
CHOLESTEROL: 121 mg/dL (ref 0–200)
HDL: 53 mg/dL (ref 35–70)
LDL Cholesterol: 48 mg/dL
TRIGLYCERIDES: 114 mg/dL (ref 40–160)

## 2016-09-17 LAB — CBC AND DIFFERENTIAL
HEMATOCRIT: 28 % — AB (ref 36–46)
HEMOGLOBIN: 9.1 g/dL — AB (ref 12.0–16.0)
WBC: 6.5 10*3/mL

## 2016-09-17 LAB — HEMOGLOBIN A1C: HEMOGLOBIN A1C: 8.2

## 2016-10-03 ENCOUNTER — Other Ambulatory Visit: Payer: Self-pay | Admitting: Osteopathic Medicine

## 2016-10-03 NOTE — Progress Notes (Signed)
Patient needs to come in for follow-up to arrange home health/hospital bed, etc.

## 2016-10-13 ENCOUNTER — Encounter: Payer: Self-pay | Admitting: Osteopathic Medicine

## 2016-10-15 ENCOUNTER — Ambulatory Visit (INDEPENDENT_AMBULATORY_CARE_PROVIDER_SITE_OTHER): Payer: Medicare Other | Admitting: Osteopathic Medicine

## 2016-10-15 VITALS — BP 132/69 | HR 112

## 2016-10-15 DIAGNOSIS — D649 Anemia, unspecified: Secondary | ICD-10-CM

## 2016-10-15 DIAGNOSIS — E119 Type 2 diabetes mellitus without complications: Secondary | ICD-10-CM

## 2016-10-15 DIAGNOSIS — S72142A Displaced intertrochanteric fracture of left femur, initial encounter for closed fracture: Secondary | ICD-10-CM

## 2016-10-15 DIAGNOSIS — F0390 Unspecified dementia without behavioral disturbance: Secondary | ICD-10-CM

## 2016-10-15 DIAGNOSIS — E039 Hypothyroidism, unspecified: Secondary | ICD-10-CM | POA: Diagnosis not present

## 2016-10-15 DIAGNOSIS — Z794 Long term (current) use of insulin: Secondary | ICD-10-CM

## 2016-10-15 MED ORDER — HYDROCODONE-ACETAMINOPHEN 5-325 MG PO TABS: 1.0000 | ORAL_TABLET | ORAL | 0 refills | Status: AC | PRN

## 2016-10-15 MED ORDER — ASPIRIN EC 325 MG PO TBEC: 325.0000 mg | DELAYED_RELEASE_TABLET | Freq: Two times a day (BID) | ORAL | 0 refills | Status: DC

## 2016-10-15 MED ORDER — ASPIRIN EC 325 MG PO TBEC: 325.0000 mg | DELAYED_RELEASE_TABLET | Freq: Every day | ORAL | 1 refills | Status: DC

## 2016-10-15 MED ORDER — GLUCOSE BLOOD VI STRP: ORAL_STRIP | 99 refills | Status: AC

## 2016-10-15 MED ORDER — ASPIRIN EC 325 MG PO TBEC: 325.0000 mg | DELAYED_RELEASE_TABLET | Freq: Every day | ORAL | 1 refills | Status: AC

## 2016-10-15 NOTE — Progress Notes (Signed)
HPI: Tasha Sheppard is a 82 y.o. female  who [facility] today, 10/15/16,  for chief complaint of:  Chief Complaint  Patient presents with  . Hospitalization Follow-up     L hip/femur fracture, underwent 2 surgeries, initial after fall at home, subsequent after fall in rehab, see ortho notes. Doing well, pt denies pain, is participating in PT.   Anemia postop - was some report from family about discussion of transfusion but this wasn't done, will recheck CBC  Tachycardia - chronic, sinus tach on EKG in ER  Cognitive problems - memory is an issue. Husband has questions about whether this will get better, "she is on medication but it doesn't seem to be working." Recognizes family, no wandering.   Reviewed ortho records: "Patient had an initial fracture that was treated with a long stroke nail without distal locks. She then fell and fractured below the lag screw. Distal interlocking screws were then placed on 09/12/16. She has been weightbearing as tolerated. She was also treated for a UTI.She has no complaints [on followup visit 10/03/16]." ASA at 325 mg daily per their instructions - family is unclear about these - unclear if she is on 325 mg or 6181   Patient is accompanied by husband and daughter who assists with history-taking.   Past medical history, surgical history, social history and family history reviewed.  Patient Active Problem List   Diagnosis Date Noted  . Displaced intertrochanteric fracture of left femur, init (HCC) 08/28/2016  . Primary osteoarthritis of left knee 08/25/2016  . Dementia without behavioral disturbance 05/27/2016  . Acute cystitis without hematuria 12/24/2015  . Knee pain, bilateral 11/22/2015  . Degeneration of cervical intervertebral disc 08/09/2015  . Elevated lipase 07/31/2015  . Type 2 diabetes mellitus (HCC) 07/17/2015  . Osteoporosis 07/17/2015  . Hypothyroidism 07/17/2015  . Essential hypertension,  benign 07/17/2015  . Memory loss 07/17/2015  . 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 Arthritis, senescent 07/17/2015    Current medication list and allergy/intolerance information reviewed.   Current Outpatient Prescriptions on File Prior to Visit  Medication Sig Dispense Refill  . alendronate (FOSAMAX) 70 MG tablet Take 1 tablet (70 mg total) by mouth once a week. Take with a full glass of water on an empty stomach. 12 tablet 11  . aspirin EC 81 MG tablet Take 1 tablet (81 mg total) by mouth daily.    . diclofenac (VOLTAREN) 50 MG EC tablet Take one tablet every 8 hours only as needed for pain, take with small snack. 45 tablet 0  . Insulin Glargine (LANTUS SOLOSTAR) 100 UNIT/ML Solostar Pen Inject 10 Units into the skin daily. 5 pen PRN  . levothyroxine (SYNTHROID, LEVOTHROID) 88 MCG tablet Take 1 tablet (88 mcg total) by mouth daily. Recheck labs in 2 months. STOP 75 mcg dose. 60 tablet 0  . memantine (NAMENDA) 10 MG tablet TAKE ONE TABLET BY MOUTH TWICE DAILY 60 tablet 2  . metFORMIN (GLUCOPHAGE) 1000 MG tablet TAKE ONE TABLET BY MOUTH TWICE DAILY FOR  BLOOD  SUGAR  CONTROL 180 tablet 2  . rosuvastatin (CRESTOR) 20 MG tablet Take 20 mg by mouth daily.     No current facility-administered medications on file prior to visit.    Allergies  Allergen Reactions  . Aricept [Donepezil Hcl]     Possibly causing worsened nausea  . Influenza Vaccines     "bad reaction"  . Pioglitazone     confusion  . Trazodone And  Nefazodone     Confusion, anxiety      Review of Systems:  Constitutional: No recent illness, treated for UTI in hospital   Cardiac: No  chest pain, No  pressure, No palpitations  Respiratory:  No  shortness of breath.  Musculoskeletal: No myalgia/arthralgia  Neurologic: No  weakness, No  Dizziness  Exam:  BP 132/69   Pulse (!) 112   Constitutional: VS see above. General Appearance: alert, well-developed, well-nourished, NAD  Eyes: Normal lids and  conjunctive, non-icteric sclera  Ears, Nose, Mouth, Throat: MMM, Normal external inspection ears/nares/mouth/lips/gums.  Neck: No masses, trachea midline.   Respiratory: Normal respiratory effort. no wheeze, no rhonchi, no rales  Cardiovascular: S1/S2 normal, no murmur, no rub/gallop auscultated. RRR. Rate 100 on auscultation .   Musculoskeletal: Gait not examined. Mild LE edema L leg, healing surgical site, neg Homan's.   Neurological: Normal balance/coordination. No tremor.  Psychiatric: Fair judgment/insight. Flat mood and affect.   Recent Results (from the past 2160 hour(s))  TSH     Status: Abnormal   Collection Time: 08/13/16  3:09 PM  Result Value Ref Range   TSH 7.39 (H) mIU/L    Comment:   Reference Range   > or = 20 Years  0.40-4.50   Pregnancy Range First trimester  0.26-2.66 Second trimester 0.55-2.73 Third trimester  0.43-2.91     POCT HgB A1C     Status: None   Collection Time: 08/15/16 11:52 AM  Result Value Ref Range   Hemoglobin A1C 10.6     No results found.  No flowsheet data found.  No flowsheet data found.    ASSESSMENT/PLAN:   Type 2 diabetes mellitus without complication, with long-term current use of insulin (HCC) - Plan: glucose blood (IGLUCOSE TEST STRIPS) test strip, COMPLETE METABOLIC PANEL WITH GFR, Hemoglobin A1c  Anemia, unspecified type - Plan: CBC, Iron and TIBC, Ferritin  Hypothyroidism, unspecified type - Plan: TSH  Dementia without behavioral disturbance, unspecified dementia type  Displaced intertrochanteric fracture of left femur, init (HCC)    Patient Instructions  Plan: Come see me in 1 month to recheck pain/healing and follow up on diabetes Labs today, will call you if severe anemia Aspirin dose needs to be at 325 mg per day until your surgeon tells you otherwise No need to take the hydrocodone if pain is ok without it     Follow-up plan: Return in about 4 weeks (around 11/12/2016) for follow-up broken leg,  diabetes - sooner if needed .  Visit summary with medication list and pertinent instructions was printed for patient to review, alert [place] if any changes needed. All questions at time of visit were answered - patient instructed to contact office with any additional concerns. ER/RTC precautions were reviewed with the patient and understanding verbalized.

## 2016-10-15 NOTE — Patient Instructions (Signed)
Plan: Come see me in 1 month to recheck pain/healing and follow up on diabetes Labs today, will call you if severe anemia Aspirin dose needs to be at 325 mg per day until your surgeon tells you otherwise No need to take the hydrocodone if pain is ok without it

## 2016-10-16 ENCOUNTER — Other Ambulatory Visit: Payer: Self-pay | Admitting: Osteopathic Medicine

## 2016-10-16 LAB — CBC
HCT: 34.5 % — ABNORMAL LOW (ref 35.0–45.0)
Hemoglobin: 10.5 g/dL — ABNORMAL LOW (ref 11.7–15.5)
MCH: 28.2 pg (ref 27.0–33.0)
MCHC: 30.4 g/dL — ABNORMAL LOW (ref 32.0–36.0)
MCV: 92.7 fL (ref 80.0–100.0)
MPV: 9.8 fL (ref 7.5–12.5)
PLATELETS: 431 10*3/uL — AB (ref 140–400)
RBC: 3.72 MIL/uL — ABNORMAL LOW (ref 3.80–5.10)
RDW: 14.4 % (ref 11.0–15.0)
WBC: 8.1 10*3/uL (ref 3.8–10.8)

## 2016-10-16 LAB — IRON AND TIBC
%SAT: 12 % (ref 11–50)
IRON: 34 ug/dL — AB (ref 45–160)
TIBC: 290 ug/dL (ref 250–450)
UIBC: 256 ug/dL (ref 125–400)

## 2016-10-16 LAB — HEMOGLOBIN A1C
HEMOGLOBIN A1C: 7.1 % — AB (ref <5.7)
Mean Plasma Glucose: 157 mg/dL

## 2016-10-16 LAB — COMPLETE METABOLIC PANEL WITH GFR
ALBUMIN: 3.8 g/dL (ref 3.6–5.1)
ALK PHOS: 84 U/L (ref 33–130)
ALT: 9 U/L (ref 6–29)
AST: 10 U/L (ref 10–35)
BILIRUBIN TOTAL: 0.3 mg/dL (ref 0.2–1.2)
BUN: 16 mg/dL (ref 7–25)
CALCIUM: 8.7 mg/dL (ref 8.6–10.4)
CO2: 26 mmol/L (ref 20–31)
CREATININE: 0.79 mg/dL (ref 0.60–0.93)
Chloride: 96 mmol/L — ABNORMAL LOW (ref 98–110)
GFR, Est African American: 82 mL/min (ref >=60)
GFR, Est Non African American: 71 mL/min (ref >=60)
Glucose, Bld: 261 mg/dL — ABNORMAL HIGH (ref 65–99)
POTASSIUM: 4.3 mmol/L (ref 3.5–5.3)
Sodium: 138 mmol/L (ref 135–146)
TOTAL PROTEIN: 6.2 g/dL (ref 6.1–8.1)

## 2016-10-16 LAB — FERRITIN: Ferritin: 51 ng/mL (ref 20–288)

## 2016-10-16 LAB — TSH: TSH: 10.58 mIU/L — ABNORMAL HIGH

## 2016-10-16 MED ORDER — LEVOTHYROXINE SODIUM 88 MCG PO TABS: 88.0000 ug | ORAL_TABLET | Freq: Every day | ORAL | 0 refills | Status: DC

## 2016-10-17 ENCOUNTER — Other Ambulatory Visit: Payer: Self-pay

## 2016-10-17 MED ORDER — LEVOTHYROXINE SODIUM 88 MCG PO TABS: 88.0000 ug | ORAL_TABLET | Freq: Every day | ORAL | 0 refills | Status: DC

## 2016-10-17 NOTE — Telephone Encounter (Signed)
HUSDHO;ISD/C

## 2016-10-20 ENCOUNTER — Telehealth: Payer: Self-pay

## 2016-10-20 DIAGNOSIS — S72142A Displaced intertrochanteric fracture of left femur, initial encounter for closed fracture: Secondary | ICD-10-CM

## 2016-10-20 NOTE — Telephone Encounter (Signed)
Can call daughter once this is arranged - thanks.

## 2016-10-20 NOTE — Telephone Encounter (Signed)
Patient daughter called 3 times and stated that during her mother office visit you said you was going [facility] and [facility] still have not [facility] it. Please advise if orders will be put in because she wants her mom to get these services. Daughter would like to be called instead of the spouse. Chrissie Dacquisto,CMA

## 2016-10-21 NOTE — Telephone Encounter (Signed)
Spoke to patient daughter advised her that referrals have been put in. Rhonda Cunningham,CMA

## 2016-10-24 ENCOUNTER — Encounter: Payer: Self-pay | Admitting: Osteopathic Medicine

## 2016-10-29 ENCOUNTER — Telehealth: Payer: Self-pay

## 2016-10-29 ENCOUNTER — Telehealth: Payer: Self-pay | Admitting: Osteopathic Medicine

## 2016-10-29 NOTE — Telephone Encounter (Signed)
Notified Diana with PT.

## 2016-10-29 NOTE — Telephone Encounter (Signed)
Tasha MossesDiana from PT called and she [name] 90-100.  With activity, it is around 125.  She wanted you to be aware, and make sure it is ok to continue with therapy.  Please advise.

## 2016-10-29 NOTE — Telephone Encounter (Signed)
error 

## 2016-10-29 NOTE — Telephone Encounter (Signed)
Pt is similarly tachycardic in the office - if no complaints of chest pain, shortness of breath, palpitations, etc then ok to participate in therapy

## 2016-11-11 ENCOUNTER — Ambulatory Visit (INDEPENDENT_AMBULATORY_CARE_PROVIDER_SITE_OTHER): Payer: Medicare Other | Admitting: Osteopathic Medicine

## 2016-11-11 ENCOUNTER — Encounter: Payer: Self-pay | Admitting: Osteopathic Medicine

## 2016-11-11 ENCOUNTER — Ambulatory Visit: Payer: Medicare Other

## 2016-11-11 ENCOUNTER — Telehealth: Payer: Self-pay | Admitting: Osteopathic Medicine

## 2016-11-11 VITALS — BP 132/70 | HR 100 | Ht 68.5 in | Wt 132.0 lb

## 2016-11-11 DIAGNOSIS — R32 Unspecified urinary incontinence: Secondary | ICD-10-CM | POA: Diagnosis not present

## 2016-11-11 DIAGNOSIS — E039 Hypothyroidism, unspecified: Secondary | ICD-10-CM

## 2016-11-11 DIAGNOSIS — E119 Type 2 diabetes mellitus without complications: Secondary | ICD-10-CM | POA: Diagnosis not present

## 2016-11-11 MED ORDER — ROSUVASTATIN CALCIUM 20 MG PO TABS: 20.0000 mg | ORAL_TABLET | Freq: Every day | ORAL | 1 refills | Status: DC

## 2016-11-11 MED ORDER — MEMANTINE HCL 10 MG PO TABS: 10.0000 mg | ORAL_TABLET | Freq: Two times a day (BID) | ORAL | 2 refills | Status: DC

## 2016-11-11 MED ORDER — METFORMIN HCL 1000 MG PO TABS: ORAL_TABLET | ORAL | 2 refills | Status: DC

## 2016-11-11 NOTE — Addendum Note (Signed)
Addended by: Pixie CasinoUNNINGHAM, RHONDA C on: 11/11/2016 03:23 PM   Modules accepted: Orders

## 2016-11-11 NOTE — Telephone Encounter (Signed)
MEDICATION REFILLS HAVE BEEN SENT. Rhonda Cunningham,CMA

## 2016-11-11 NOTE — Progress Notes (Addendum)
HPI: Tasha RowanMary Sheppard is a 82 y.o. female  who [facility] [place] today, 11/11/16,  for chief complaint of:  Chief Complaint  Patient presents with  . Follow-up    DIABETES    DM2: back on low-dose long-acting Insulin since last visit, no hypoglycemia  Fall: rehabbing from hip fracture, not using walker, had another fall at home but no pain, is ambulating fairly well  Incontinence: urinating in diaper, not using timed voiding, husband is a bit frustrated and concerned, she is going in diaper overnight. No altered mental status beyond baseline dementia, no fever.   Hypothyroid: still on levothyroxine 88 mcg due for recheck   Refills: should have sufficient refills but husband states pharmacy told them the dates were incorrect and authorization was needed. To my knowledge we have not received requests.    Past medical history, surgical history, social history and family history reviewed.  Patient Active Problem List   Diagnosis Date Noted  . Anemia 10/15/2016  . Displaced intertrochanteric fracture of left femur, init (HCC) 08/28/2016  . Primary osteoarthritis of left knee 08/25/2016  . Dementia without behavioral disturbance 05/27/2016  . Acute cystitis without hematuria 12/24/2015  . Knee pain, bilateral 11/22/2015  . Degeneration of cervical intervertebral disc 08/09/2015  . Elevated lipase 07/31/2015  . Type 2 diabetes mellitus (HCC) 07/17/2015  . Osteoporosis 07/17/2015  . Hypothyroidism 07/17/2015  . Essential hypertension, benign 07/17/2015  . Memory loss 07/17/2015  . 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 Arthritis, senescent 07/17/2015    Current medication list and allergy/intolerance information reviewed.   Current Outpatient Prescriptions on File Prior to Visit  Medication Sig Dispense Refill  . alendronate (FOSAMAX) 70 MG tablet Take 1 tablet (70 mg total) by mouth once a week. Take with a full glass of water on an  empty stomach. 12 tablet 11  . aspirin EC 325 MG tablet Take 1 tablet (325 mg total) by mouth daily. 30 tablet 1  . diclofenac (VOLTAREN) 50 MG EC tablet Take one tablet every 8 hours only as needed for pain, take with small snack. 45 tablet 0  . glucose blood (IGLUCOSE TEST STRIPS) test strip Use as instructed up to qid with glucometer. Dx: E11.9 100 each 99  . HYDROcodone-acetaminophen (NORCO/VICODIN) 5-325 MG tablet Take 1 tablet by mouth every 4 (four) hours as needed for moderate pain or severe pain. 30 tablet 0  . Insulin Glargine (LANTUS SOLOSTAR) 100 UNIT/ML Solostar Pen Inject 10 Units into the skin daily. 5 pen PRN  . levothyroxine (SYNTHROID, LEVOTHROID) 88 MCG tablet Take 1 tablet (88 mcg total) by mouth daily before breakfast. Recheck labs in mid-March. STOP 75 mcg dose. 60 tablet 0  . memantine (NAMENDA) 10 MG tablet TAKE ONE TABLET BY MOUTH TWICE DAILY 60 tablet 2  . metFORMIN (GLUCOPHAGE) 1000 MG tablet TAKE ONE TABLET BY MOUTH TWICE DAILY FOR  BLOOD  SUGAR  CONTROL 180 tablet 2  . rosuvastatin (CRESTOR) 20 MG tablet Take 20 mg by mouth daily.     No current facility-administered medications on file prior to visit.    Allergies  Allergen Reactions  . Aricept [Donepezil Hcl]     Possibly causing worsened nausea  . Influenza Vaccines     "bad reaction"  . Pioglitazone     confusion  . Trazodone And Nefazodone     Confusion, anxiety      Review of Systems:  Constitutional: No recent illness  HEENT: No  headache  Cardiac: No  chest pain  Respiratory:  No  shortness of breath  Gastrointestinal: No  abdominal pain  Musculoskeletal: No new myalgia/arthralgia  Exam:  BP 132/70   Pulse 100   Ht 5' 8.5" (1.74 m)   Wt 132 lb (59.9 kg)   BMI 19.78 kg/m   Constitutional: VS see above. General Appearance: alert, well-developed, well-nourished, NAD  Neck: No masses, trachea midline.   Respiratory: Normal respiratory effort. no wheeze, no rhonchi, no  rales  Cardiovascular: S1/S2 normal, no murmur, no rub/gallop auscultated. RRR.   Musculoskeletal: Symmetric and independent movement of all extremities  Neurological: Normal balance/coordination. No tremor.  Skin: warm, dry, intact.   Psychiatric: Fair judgment/insight. Flat mood and affect.     Results for orders placed or performed in visit on 11/11/16 (from the past 72 hour(s))  TSH     Status: Abnormal   Collection Time: 11/11/16  3:01 PM  Result Value Ref Range   TSH 0.09 (L) mIU/L    Comment:   Reference Range   > or = 20 Years  0.40-4.50   Pregnancy Range First trimester  0.26-2.66 Second trimester 0.55-2.73 Third trimester  0.43-2.91     Hemoglobin A1c     Status: Abnormal   Collection Time: 11/11/16  3:01 PM  Result Value Ref Range   Hgb A1c MFr Bld 7.3 (H) <5.7 %    Comment:   For someone without known diabetes, a hemoglobin A1c value of 6.5% or greater indicates that they may have diabetes and this should be confirmed with a follow-up test.   For someone with known diabetes, a value <7% indicates that their diabetes is well controlled and a value greater than or equal to 7% indicates suboptimal control. A1c targets should be individualized based on duration of diabetes, age, comorbid conditions, and other considerations.   Currently, no consensus exists for use of hemoglobin A1c for diagnosis of diabetes for children.      Mean Plasma Glucose 163 mg/dL  BASIC METABOLIC PANEL WITH GFR     Status: Abnormal   Collection Time: 11/11/16  3:01 PM  Result Value Ref Range   Sodium 138 135 - 146 mmol/L   Potassium 4.0 3.5 - 5.3 mmol/L   Chloride 101 98 - 110 mmol/L   CO2 25 20 - 31 mmol/L   Glucose, Bld 204 (H) 65 - 99 mg/dL   BUN 12 7 - 25 mg/dL   Creat 4.09 8.11 - 9.14 mg/dL    Comment:   For patients > or = 80 years of age: The upper reference limit for Creatinine is approximately 13% higher for people identified as African-American.      Calcium  8.6 8.6 - 10.4 mg/dL   GFR, Est African American >89 >=60 mL/min   GFR, Est Non African American 79 >=60 mL/min     No results found for this or any previous visit (from the past 24 hour(s)).   No results found.  No flowsheet data found.  Depression screen [facility] 2/9 11/11/2016  Decreased Interest 0  Down, Depressed, Hopeless 0  PHQ - 2 Score 0      ASSESSMENT/PLAN:   Unable to give urine sample in office  A1c unable to be run on point-of-care machine, will get with blood draw.  Diabetes mellitus without complication (HCC) - A1C good at this time - Plan: Hemoglobin A1c, BASIC METABOLIC PANEL WITH GFR, CANCELED: POCT HgB A1C  Hypothyroidism, unspecified type - Confirmed only taking one 88 mcg  pill per day - will lower dose to 75 - Plan: TSH, BASIC METABOLIC PANEL WITH GFR  Urinary incontinence, unspecified type - pt to provide sample when able to go - husband given hat and collection instructions - Plan: Urinalysis, Routine w reflex microscopic  Type 2 diabetes mellitus without complication, without long-term current use of insulin (HCC) - Plan: metFORMIN (GLUCOPHAGE) 1000 MG tablet    Patient Instructions  Plan:  Will let you know about thyroid and/or diabetes medication adjustments if needed based on labs   Will let you know about urine results once these are available - may take 2-3 days   Timed voiding: bathroom while awake every 2-3 hours whether you feel urge to pee or not  Use walker/cane to prevent falls!     Follow-up plan: Return in about 3 months (around 02/11/2017) for recheck diabetes, sooner if needed / based on labs.  Visit summary with medication list and pertinent instructions was printed for patient to review, alert [place] if any changes needed. All questions at time of visit were answered - patient instructed to contact office with any additional concerns. ER/RTC precautions were reviewed with the patient and understanding verbalized.

## 2016-11-11 NOTE — Telephone Encounter (Signed)
Please call pharmacy: Has been told me that they are not authorizing refills for Crestor - something to do with the dates - she should have plenty of refills on this, can we please clarify and make sure that there are adequate refills for this and for the memantine and metformin? Thank you

## 2016-11-11 NOTE — Patient Instructions (Addendum)
Plan:  Will let you know about thyroid and/or diabetes medication adjustments if needed based on labs   Will let you know about urine results once these are available - may take 2-3 days   Timed voiding: bathroom while awake every 2-3 hours whether you feel urge to pee or not  Use walker/cane to prevent falls!

## 2016-11-12 LAB — BASIC METABOLIC PANEL WITH GFR
BUN: 12 mg/dL (ref 7–25)
CALCIUM: 8.6 mg/dL (ref 8.6–10.4)
CO2: 25 mmol/L (ref 20–31)
Chloride: 101 mmol/L (ref 98–110)
Creat: 0.73 mg/dL (ref 0.60–0.93)
GFR, EST NON AFRICAN AMERICAN: 79 mL/min (ref >=60)
Glucose, Bld: 204 mg/dL — ABNORMAL HIGH (ref 65–99)
Potassium: 4 mmol/L (ref 3.5–5.3)
SODIUM: 138 mmol/L (ref 135–146)

## 2016-11-12 LAB — HEMOGLOBIN A1C
HEMOGLOBIN A1C: 7.3 % — AB (ref <5.7)
Mean Plasma Glucose: 163 mg/dL

## 2016-11-12 LAB — TSH: TSH: 0.09 m[IU]/L — AB

## 2016-11-13 MED ORDER — LEVOTHYROXINE SODIUM 75 MCG PO TABS: 75.0000 ug | ORAL_TABLET | Freq: Every day | ORAL | 0 refills | Status: DC

## 2016-11-13 NOTE — Addendum Note (Signed)
Addended by: Deirdre PippinsALEXANDER, Arlo Butt M on: 11/13/2016 11:50 AM   Modules accepted: Orders

## 2016-11-17 ENCOUNTER — Other Ambulatory Visit: Payer: Self-pay

## 2016-11-17 DIAGNOSIS — M810 Age-related osteoporosis without current pathological fracture: Secondary | ICD-10-CM

## 2016-11-17 MED ORDER — ALENDRONATE SODIUM 70 MG PO TABS: 70.0000 mg | ORAL_TABLET | ORAL | 11 refills | Status: DC

## 2016-11-17 NOTE — Progress Notes (Unsigned)
Refilled

## 2016-11-24 ENCOUNTER — Ambulatory Visit (INDEPENDENT_AMBULATORY_CARE_PROVIDER_SITE_OTHER): Payer: Medicare Other | Admitting: Osteopathic Medicine

## 2016-11-24 VITALS — BP 133/84 | HR 101 | Temp 98.5°F | Resp 16 | Wt 126.0 lb

## 2016-11-24 DIAGNOSIS — G8929 Other chronic pain: Secondary | ICD-10-CM | POA: Diagnosis not present

## 2016-11-24 DIAGNOSIS — M25562 Pain in left knee: Secondary | ICD-10-CM | POA: Diagnosis not present

## 2016-11-24 DIAGNOSIS — S72142A Displaced intertrochanteric fracture of left femur, initial encounter for closed fracture: Secondary | ICD-10-CM | POA: Diagnosis not present

## 2016-11-24 DIAGNOSIS — E119 Type 2 diabetes mellitus without complications: Secondary | ICD-10-CM | POA: Diagnosis not present

## 2016-11-24 DIAGNOSIS — F0390 Unspecified dementia without behavioral disturbance: Secondary | ICD-10-CM

## 2016-11-24 MED ORDER — DICLOFENAC SODIUM 25 MG PO TBEC: 25.0000 mg | DELAYED_RELEASE_TABLET | Freq: Three times a day (TID) | ORAL | 0 refills | Status: DC

## 2016-11-24 NOTE — Progress Notes (Signed)
HPI: Tasha Sheppard is a 82 y.o. female  who presents to [facility] today, 11/24/16,  for chief complaint of:  Chief Complaint  Patient presents with  . Follow-up    knee pain    Knee pain . Context: Previously seen by sports medicine for this issue. Was undergoing OthoVisc injections and about 3 days after her last one she suffered a fall, has been blamed injection for this fall and concurrent hip fracture so he is a bit suspicious of going back to see sports medicine for this. . Location: L knee is worse but both knees bother her . Duration: chronic  . Modifying factors: Tylenol helping sometimes, he is occasionally giving her the hydrocodone which was given to her by orthopedics for her hip  Memory/dementia: Appears stable. Patient was initially on the schedule for cognitive testing but has been does not inquire about this today, just knee pain. States memory is about the same  Diabetes: He is occasionally playing with her dose of insulin, typically not giving her more than 10 units but sometimes will get a little extra if she eats a good bit of sweets. No hypoglycemic symptoms.  Patient is accompanied by husband who assists with history-taking.   Past medical history, surgical history, social history and family history reviewed.  Patient Active Problem List   Diagnosis Date Noted  . Anemia 10/15/2016  . Displaced intertrochanteric fracture of left femur, init (HCC) 08/28/2016  . Primary osteoarthritis of left knee 08/25/2016  . Dementia without behavioral disturbance 05/27/2016  . Acute cystitis without hematuria 12/24/2015  . Knee pain, bilateral 11/22/2015  . Degeneration of cervical intervertebral disc 08/09/2015  . Elevated lipase 07/31/2015  . Type 2 diabetes mellitus (HCC) 07/17/2015  . Osteoporosis 07/17/2015  . Hypothyroidism 07/17/2015  . Essential hypertension, benign 07/17/2015  . Memory loss 07/17/2015  . Branch retinal artery  occlusion 07/17/2015  . Arthritis, senescent 07/17/2015    Current medication list and allergy/intolerance information reviewed.   Current Outpatient Prescriptions on File Prior to Visit  Medication Sig Dispense Refill  . alendronate (FOSAMAX) 70 MG tablet Take 1 tablet (70 mg total) by mouth once a week. Take with a full glass of water on an empty stomach. 12 tablet 11  . aspirin EC 325 MG tablet Take 1 tablet (325 mg total) by mouth daily. 30 tablet 1  . diclofenac (VOLTAREN) 50 MG EC tablet Take one tablet every 8 hours only as needed for pain, take with small snack. 45 tablet 0  . glucose blood (IGLUCOSE TEST STRIPS) test strip Use as instructed up to qid with glucometer. Dx: E11.9 100 each 99  . HYDROcodone-acetaminophen (NORCO/VICODIN) 5-325 MG tablet Take 1 tablet by mouth every 4 (four) hours as needed for moderate pain or severe pain. 30 tablet 0  . Insulin Glargine (LANTUS SOLOSTAR) 100 UNIT/ML Solostar Pen Inject 10 Units into the skin daily. 5 pen PRN  . levothyroxine (SYNTHROID, LEVOTHROID) 75 MCG tablet Take 1 tablet (75 mcg total) by mouth daily. 90 tablet 0  . memantine (NAMENDA) 10 MG tablet Take 1 tablet (10 mg total) by mouth 2 (two) times daily. 60 tablet 2  . metFORMIN (GLUCOPHAGE) 1000 MG tablet TAKE ONE TABLET BY MOUTH TWICE DAILY FOR  BLOOD  SUGAR  CONTROL 180 tablet 2  . rosuvastatin (CRESTOR) 20 MG tablet Take 1 tablet (20 mg total) by mouth daily. 90 tablet 1   No current facility-administered medications on file prior to visit.  Allergies  Allergen Reactions  . Aricept [Donepezil Hcl]     Possibly causing worsened nausea  . Influenza Vaccines     "bad reaction"  . Pioglitazone     confusion  . Trazodone And Nefazodone     Confusion, anxiety      Review of Systems: per husband  Constitutional: No recent illness  Cardiac: No  chest pain,  Respiratory:  No  shortness of breath. No  Cough  Gastrointestinal: No  abdominal pain  Musculoskeletal: No  new myalgia/arthralgia, +chronic knee pain  Skin: No  Rash  Neurologic: +generalized weakness, No  Dizziness, no AMS   Exam:  BP 133/84 (BP Location: Left Leg, Patient Position: Sitting, Cuff Size: Normal)   Pulse (!) 101   Temp 98.5 F (36.9 C) (Oral)   Resp 16   Wt 126 lb (57.2 kg)   SpO2 96%   BMI 18.88 kg/m   Constitutional: VS see above. General Appearance: alert, well-developed, well-nourished, NAD  Eyes: Normal lids and conjunctive, non-icteric sclera  Respiratory: Normal respiratory effort. no wheeze, no rhonchi, no rales  Cardiovascular: S1/S2 normal, no murmur, no rub/gallop auscultated. RRR.   Musculoskeletal: Gait normal. Symmetric and independent movement of all extremities. Mild crepitus to left knee, no effusion.  Neurological: Normal balance/coordination. No tremor.  Skin: warm, dry, intact.   Psychiatric: Fair judgment/insight. Flat mood and affect. Husband does most of the talking      ASSESSMENT/PLAN: The primary encounter diagnosis was Chronic pain of left knee. Diagnoses of Displaced intertrochanteric fracture of left femur, init (HCC), Diabetes mellitus without complication (HCC), and Dementia without behavioral disturbance, unspecified dementia type were also pertinent to this visit.   Trial reduced dose of all Terrance since patient is currently supposedly still on high-dose aspirin for DVT prophylaxis postsurgical though husband is unclear if she is actually taking this.  Advised patient he may need to schedule separate appointment for orthopedics to look at knee but at this point if they are no longer interested in injections patient may require MRI and other orthopedic management. Advised continued participation in physical therapy, home exercises given for knee strengthening.   Patient Instructions  Plan:  Knee pain 1. Add Diclofenac (Voltaren) - see prescription. We are putting you on a low dose of this until the aspirin is decreased - ask  orthopedics about the aspirin.  2. Can take with Tylenol 500 mg three times per day.  3. Can use the Hydrocodone daily as needed for severe pain. 4. On follow-up with orthopedics - Dr Christell Constant - call their office and see if they can also look at her knee when you follow-up for the hip: [phone].    Follow-up plan: Return if symptoms worsen or fail to improve.  Visit summary with medication list and pertinent instructions was printed for patient to review, alert [place] if any changes needed. All questions at time of visit were answered - patient instructed to contact office with any additional concerns. ER/RTC precautions were reviewed with the patient and understanding verbalized.

## 2016-11-24 NOTE — Patient Instructions (Addendum)
Plan:  Knee pain 1. Add Diclofenac (Voltaren) - see prescription. We are putting you on a low dose of this until the aspirin is decreased - ask orthopedics about the aspirin.  2. Can take with Tylenol 500 mg three times per day.  3. Can use the Hydrocodone daily as needed for severe pain. 4. On follow-up with orthopedics - Dr Christell Constant - call their office and see if they can also look at her knee when you follow-up for the hip: [phone].

## 2017-02-15 ENCOUNTER — Other Ambulatory Visit: Payer: Self-pay | Admitting: Osteopathic Medicine

## 2017-02-23 ENCOUNTER — Ambulatory Visit (INDEPENDENT_AMBULATORY_CARE_PROVIDER_SITE_OTHER): Payer: Medicare Other | Admitting: Osteopathic Medicine

## 2017-02-23 VITALS — BP 116/56 | HR 104 | Wt 124.0 lb

## 2017-02-23 DIAGNOSIS — E119 Type 2 diabetes mellitus without complications: Secondary | ICD-10-CM

## 2017-02-23 DIAGNOSIS — Z794 Long term (current) use of insulin: Secondary | ICD-10-CM

## 2017-02-23 DIAGNOSIS — R413 Other amnesia: Secondary | ICD-10-CM

## 2017-02-23 DIAGNOSIS — E039 Hypothyroidism, unspecified: Secondary | ICD-10-CM

## 2017-02-23 DIAGNOSIS — R634 Abnormal weight loss: Secondary | ICD-10-CM | POA: Diagnosis not present

## 2017-02-23 LAB — POCT GLYCOSYLATED HEMOGLOBIN (HGB A1C): HEMOGLOBIN A1C: 7.1

## 2017-02-23 NOTE — Patient Instructions (Signed)
   Sugars are looking good! Keep doing what you're doing!   We need to recheck the thyroid again and may require another dose adjustment. The levels haven't changed much despite lowering dose several times, we may want to consider just stopping the medicine and seeing how things are looking after 6-8 weeks.

## 2017-02-23 NOTE — Progress Notes (Signed)
HPI: Tasha Sheppard is a 82 y.o. female  who presents to [facility] today, 02/23/17,  for chief complaint of:  Chief Complaint  Patient presents with  . Follow-up   DM2: 15 units Lantus per day, taking metformin, last A1C 7.3% 3 months ago, low appetite but no hypoglycemia symptoms. Husband has concerns about weight loss over the past year.   Weight loss: slow decline in functional status and poorly managed thyroid (persistent low TS despite multiple reductions in synthroid dose), low appetite - no abdominal pain or coughing, no night sweats or fever   Memory loss: no change, forgetful but no mood issues.   Hypothyroid: see above, husband isn't sure what dose synthroid she is on...   Patient is accompanied by husband who assists with history-taking.   Past medical history, surgical history, social history and family history reviewed.  Patient Active Problem List   Diagnosis Date Noted  . Anemia 10/15/2016  . Displaced intertrochanteric fracture of left femur, init (HCC) 08/28/2016  . Primary osteoarthritis of left knee 08/25/2016  . Dementia without behavioral disturbance 05/27/2016  . Acute cystitis without hematuria 12/24/2015  . Knee pain, bilateral 11/22/2015  . Degeneration of cervical intervertebral disc 08/09/2015  . Elevated lipase 07/31/2015  . Type 2 diabetes mellitus (HCC) 07/17/2015  . Osteoporosis 07/17/2015  . Hypothyroidism 07/17/2015  . Essential hypertension, benign 07/17/2015  . Memory loss 07/17/2015  . 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 Arthritis, senescent 07/17/2015    Current medication list and allergy/intolerance information reviewed.   Current Outpatient Prescriptions on File Prior to Visit  Medication Sig Dispense Refill  . alendronate (FOSAMAX) 70 MG tablet Take 1 tablet (70 mg total) by mouth once a week. Take with a full glass of water on an empty stomach. 12 tablet 11  . aspirin EC 325 MG tablet Take  1 tablet (325 mg total) by mouth daily. 30 tablet 1  . diclofenac (VOLTAREN) 25 MG EC tablet Take 1 tablet (25 mg total) by mouth 3 (three) times daily. take with small snack. 90 tablet 0  . glucose blood (IGLUCOSE TEST STRIPS) test strip Use as instructed up to qid with glucometer. Dx: E11.9 100 each 99  . HYDROcodone-acetaminophen (NORCO/VICODIN) 5-325 MG tablet Take 1 tablet by mouth every 4 (four) hours as needed for moderate pain or severe pain. 30 tablet 0  . Insulin Glargine (LANTUS SOLOSTAR) 100 UNIT/ML Solostar Pen Inject 10 Units into the skin daily. 5 pen PRN  . levothyroxine (SYNTHROID, LEVOTHROID) 75 MCG tablet Take 1 tablet (75 mcg total) by mouth daily. 90 tablet 0  . levothyroxine (SYNTHROID, LEVOTHROID) 88 MCG tablet TAKE 1 TABLET BY MOUTH ONCE DAILY BEFORE BREAKFAST 60 tablet 0  . memantine (NAMENDA) 10 MG tablet Take 1 tablet (10 mg total) by mouth 2 (two) times daily. 60 tablet 2  . metFORMIN (GLUCOPHAGE) 1000 MG tablet TAKE ONE TABLET BY MOUTH TWICE DAILY FOR  BLOOD  SUGAR  CONTROL 180 tablet 2  . rosuvastatin (CRESTOR) 20 MG tablet Take 1 tablet (20 mg total) by mouth daily. 90 tablet 1   No current facility-administered medications on file prior to visit.    Allergies  Allergen Reactions  . Aricept [Donepezil Hcl]     Possibly causing worsened nausea  . Influenza Vaccines     "bad reaction"  . Pioglitazone     confusion  . Trazodone And Nefazodone     Confusion, anxiety      Review  of Systems:  Constitutional: No recent illness  Cardiac: No  chest pain  Respiratory:  No  shortness of breath.   Gastrointestinal: No  abdominal pain, no change on bowel habits   Musculoskeletal: No new myalgia/arthralgia  Neurologic: No  weakness, No  Dizziness   Exam:  BP (!) 116/56   Pulse (!) 104   Wt 124 lb (56.2 kg)   SpO2 99%   BMI 18.58 kg/m   Constitutional: VS see above. General Appearance: alert, well-developed, well-nourished, NAD  Eyes: Normal lids and  conjunctive, non-icteric sclera  Ears, Nose, Mouth, Throat: MMM, Normal external inspection ears/nares/mouth/lips/gums.  Neck: No masses, trachea midline.   Respiratory: Normal respiratory effort. no wheeze, no rhonchi, no rales  Cardiovascular: S1/S2 normal, no murmur, no rub/gallop auscultated. RRR.   Musculoskeletal: Gait normal. Symmetric and independent movement of all extremities  Neurological: Normal balance/coordination. No tremor.  Skin: warm, dry, intact.   Psychiatric: Normal judgment/insight. Normal mood and affect. Oriented x3.    Recent Results (from the past 2160 hour(s))  POCT HgB A1C     Status: None   Collection Time: 02/23/17  2:56 PM  Result Value Ref Range   Hemoglobin A1C 7.1       ASSESSMENT/PLAN:   Type 2 diabetes mellitus without complication, with long-term current use of insulin (HCC) - Plan: POCT HgB A1C  Hypothyroidism, unspecified type - Plan: TSH  Memory loss - stable, advised unlikely to improve, manage secondary issues as best we can (thyroid, ASA/statin prevention for CVA etc)   Weight loss - no concerning symptoms and with deconditioning and persistently abn thyroid - get thyroid under control and reevaluate depending on weight/symptoms     Patient Instructions   Sugars are looking good! Keep doing what you're doing!   We need to recheck the thyroid again and may require another dose adjustment. The levels haven't changed much despite lowering dose several times, we may want to consider just stopping the medicine and seeing how things are looking after 6-8 weeks.     Follow-up plan: Return in about 3 months (around 05/26/2017) for recheck diabetes, sooner if needed .  Visit summary with medication list and pertinent instructions was printed for patient to review, alert us if any changes needed. All questions at time of visit were answered - patient instructed to contact office with any additional concerns. ER/RTC precautions were  reviewed with the patient and understanding verbalized.

## 2017-02-24 ENCOUNTER — Other Ambulatory Visit: Payer: Self-pay | Admitting: Osteopathic Medicine

## 2017-02-24 LAB — TSH: TSH: 0.03 m[IU]/L — AB

## 2017-02-24 MED ORDER — LEVOTHYROXINE SODIUM 50 MCG PO TABS: 50.0000 ug | ORAL_TABLET | Freq: Every day | ORAL | 0 refills | Status: DC

## 2017-02-24 NOTE — Progress Notes (Unsigned)
Adjusting synthroid again

## 2017-03-06 IMAGING — CT CT ABD-PELV W/ CM
2 of 5 series · 16 of 46 positions shown, 18 images · IV contrast (APPLIED)
Comparison: Abdominal ultrasound on 07/27/2015

CLINICAL DATA: Elevated lipase, weakness and nausea.

EXAM:
CT ABDOMEN AND PELVIS WITH CONTRAST
TECHNIQUE: Multidetector CT imaging of the abdomen and pelvis was performed
using the standard protocol following bolus administration of
intravenous contrast.
CONTRAST:  100mL OMNIPAQUE IOHEXOL 300 MG/ML  SOLN

[Series 2: abd/pelvis 5.0 b31f · axial · 0.76mm/px · z∈[-592,-212]mm · 13 of 86 slices shown, 15 images]
[im 5/86  soft-tissue]
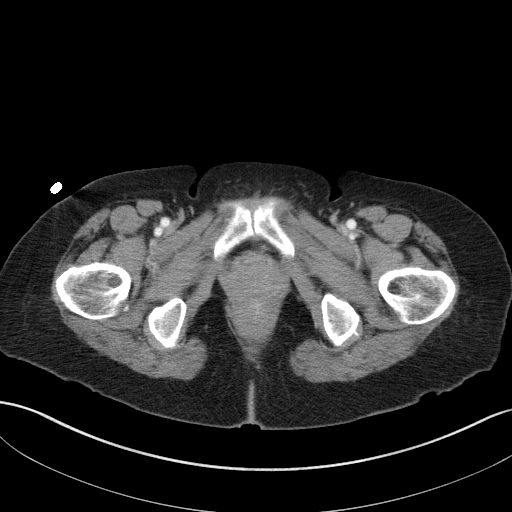
[im 5/86  bone]
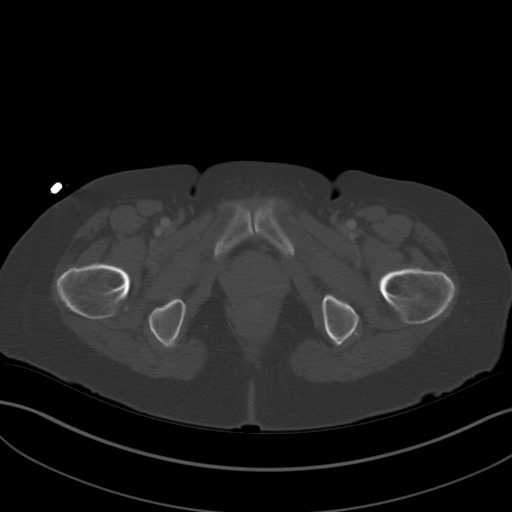
[im 13/86  soft-tissue]
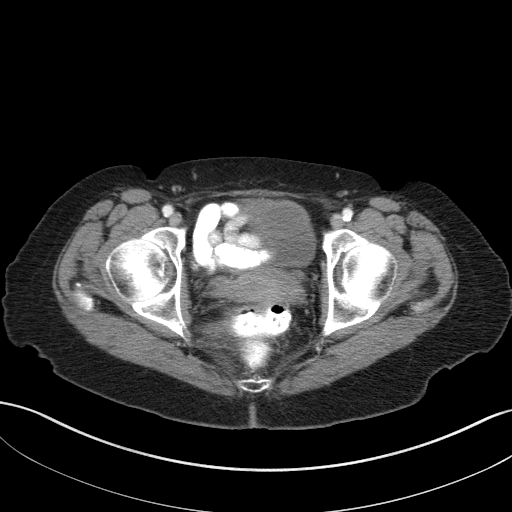
[im 18/86  soft-tissue]
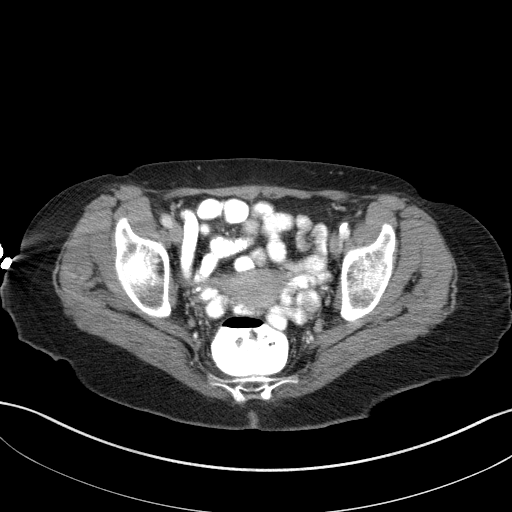
[im 26/86  soft-tissue]
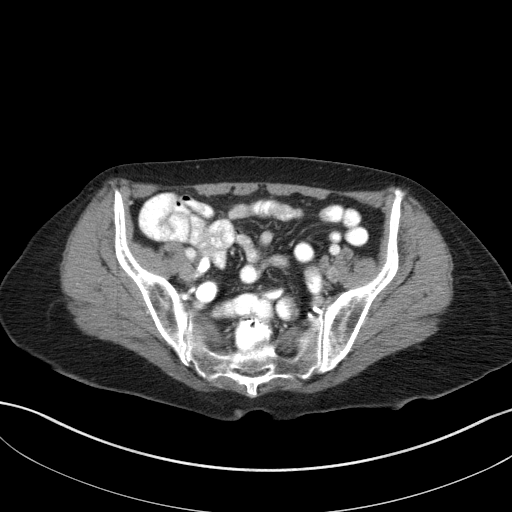
[im 30/86  soft-tissue]
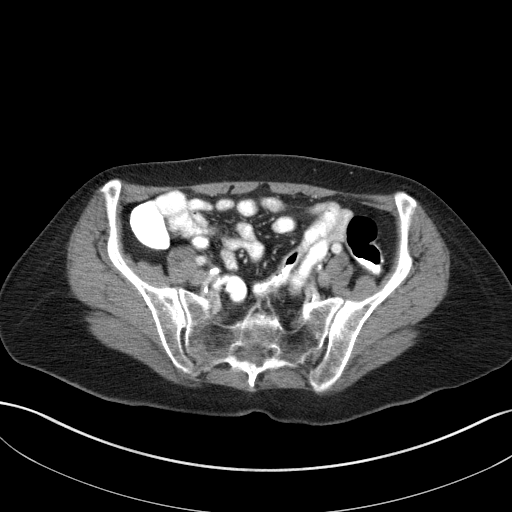
[im 39/86  soft-tissue]
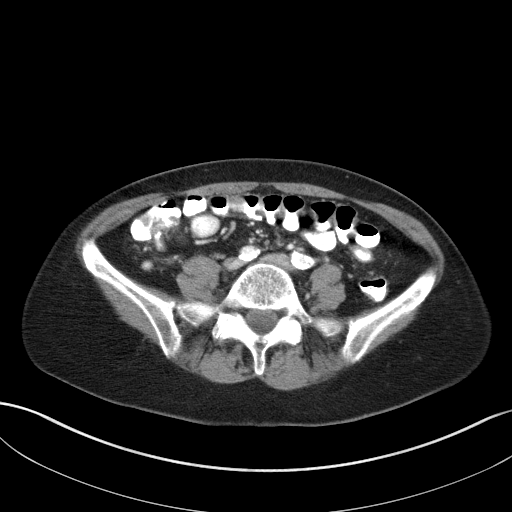
[im 43/86  soft-tissue]
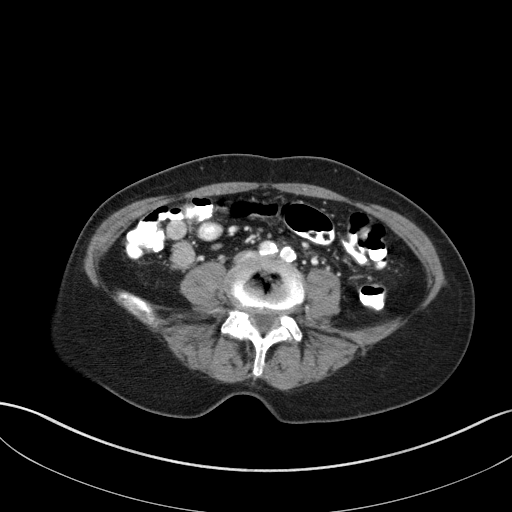
[im 47/86  soft-tissue]
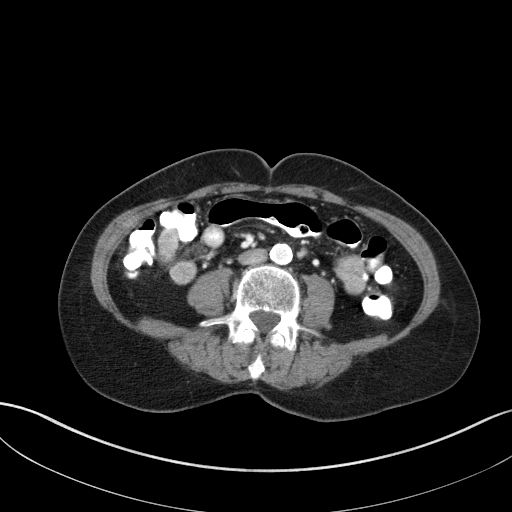
[im 56/86  soft-tissue]
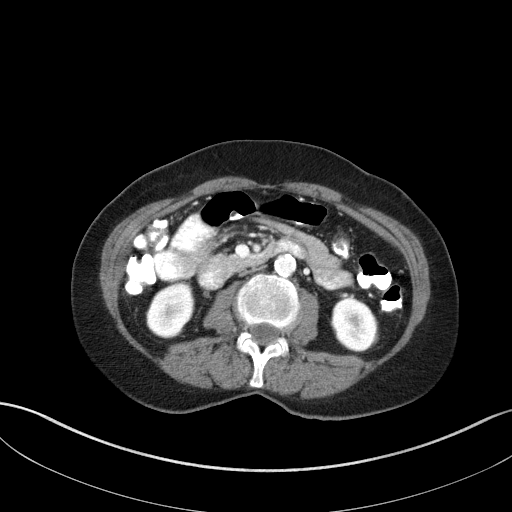
[im 56/86  bone]
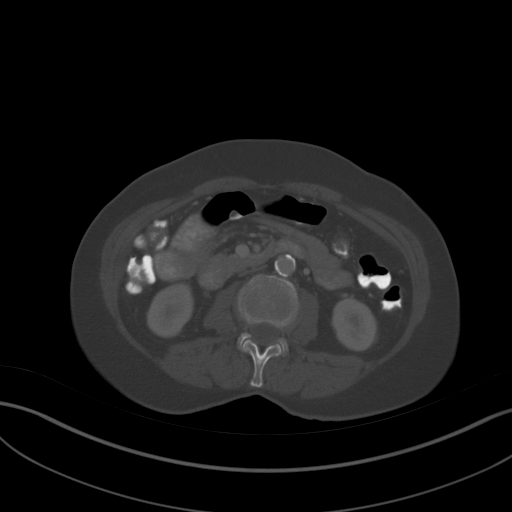
[im 60/86  soft-tissue]
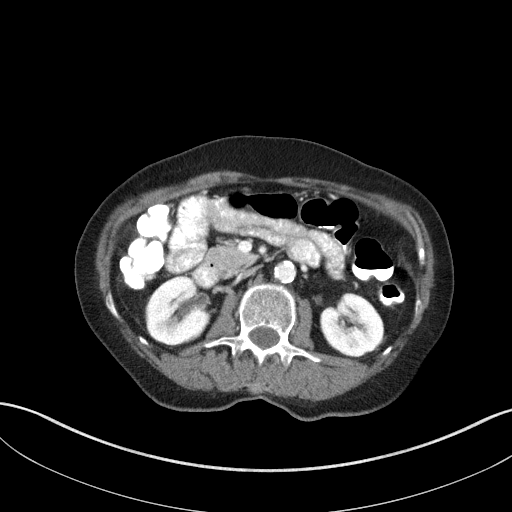
[im 69/86  soft-tissue]
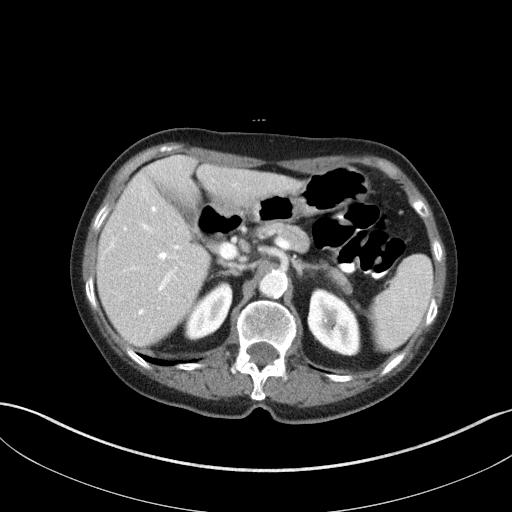
[im 73/86  soft-tissue]
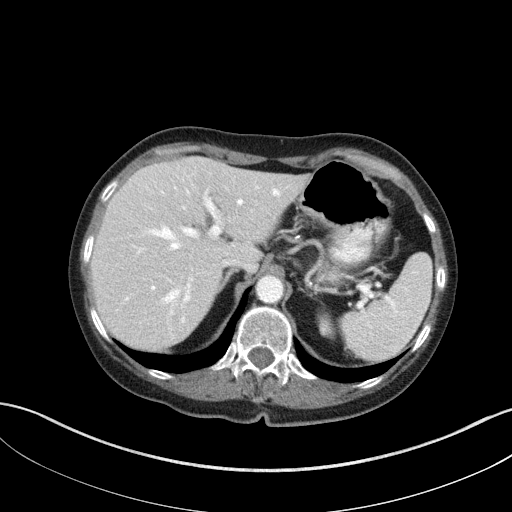
[im 81/86  soft-tissue]
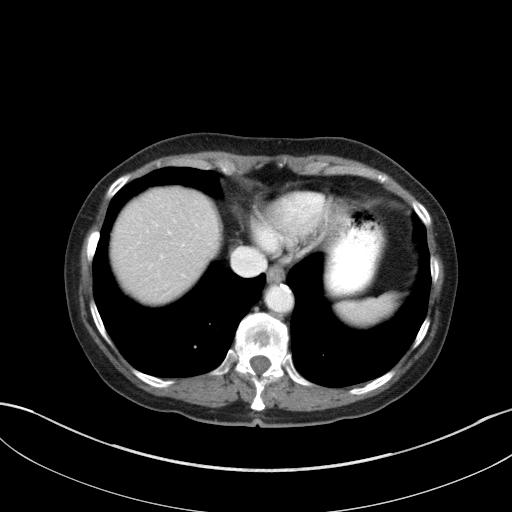

[Series 5: abd/pelvis 3.0 coronal · coronal · 0.70mm/px · 3 of 75 slices shown]
[im 25/75  soft-tissue]
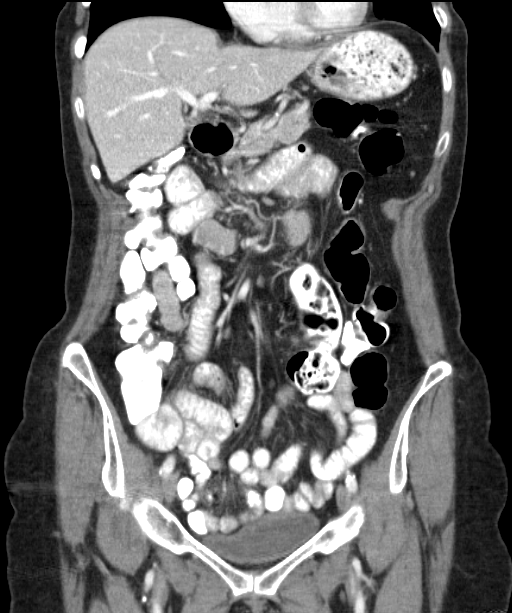
[im 33/75  soft-tissue]
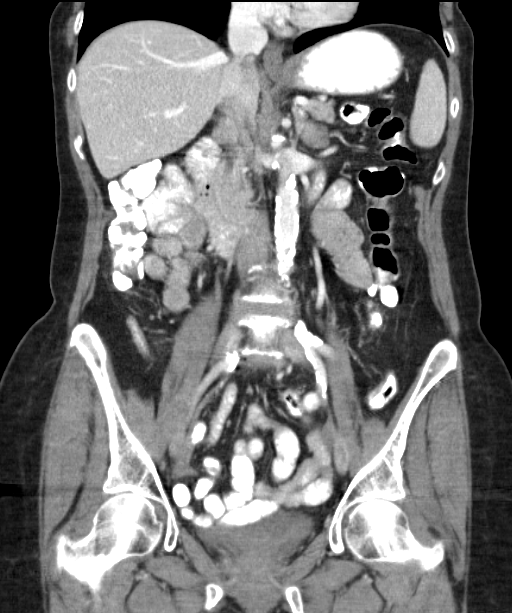
[im 42/75  soft-tissue]
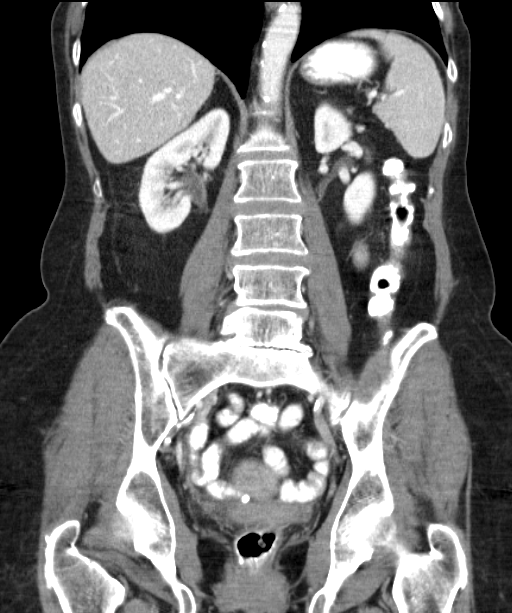

[16 of 46 positions shown; findings below may reference images not displayed]

FINDINGS: The liver, gallbladder, pancreas, spleen, adrenal glands and kidneys
appear normal by CT. There is no evidence by CT to suggest obvious
acute pancreatitis. No evidence of pancreatic pseudocyst or
pancreatic mass.

Bowel is within normal limits a shows no evidence of inflammation or
obstruction. No free air, free fluid, abscess, mass or
lymphadenopathy is detected. No hernias are seen.

The abdominal aorta is heavily calcified and shows no evidence of
aneurysmal disease. Calcified plaque also extends into the iliac
arteries bilaterally. The bladder, uterus and adnexal regions are
unremarkable by CT. Bony structures show vacuum disc at the L4-5
level and moderate disc space narrowing with vacuum disc at the
L5-S1 level. There is mild compression of the superior endplate of
T11.
IMPRESSION: 1. No evidence of pancreatitis or other acute findings in the
abdomen or pelvis.
2. Advanced atherosclerosis of the abdominal aorta and iliac
arteries without evidence of aneurysmal disease.
3. Mild superior endplate compression of the T11 vertebral body.

## 2017-03-15 ENCOUNTER — Other Ambulatory Visit: Payer: Self-pay | Admitting: Osteopathic Medicine

## 2017-03-19 ENCOUNTER — Other Ambulatory Visit: Payer: Self-pay | Admitting: Osteopathic Medicine

## 2017-04-14 ENCOUNTER — Other Ambulatory Visit: Payer: Self-pay | Admitting: Osteopathic Medicine

## 2017-04-14 MED ORDER — INSULIN GLARGINE 100 UNIT/ML SOLOSTAR PEN: 15.0000 [IU] | PEN_INJECTOR | Freq: Every day | SUBCUTANEOUS | 99 refills | Status: DC

## 2017-04-14 NOTE — Progress Notes (Signed)
Rx-insulin

## 2017-04-16 ENCOUNTER — Telehealth: Payer: Self-pay | Admitting: Osteopathic Medicine

## 2017-04-16 NOTE — Telephone Encounter (Signed)
Tasha Sheppard called regarding Tasha Sheppard adv that he has left message for a nurse to call back and discuss her insulin and no one has called him back. The insulin for 1 box is $75.00 and can not afford that. [phone]. Thanks

## 2017-04-16 NOTE — Telephone Encounter (Signed)
Called the patient and advised that he call his insurance to see what is on their preferred list.I called the patient pharmacy and they have the script in hold and will have it ready for him to pick up. Advised the patient that if it cost too much then he would have to call his pharmacy. Rhonda Cunningham,CMA

## 2017-04-19 ENCOUNTER — Other Ambulatory Visit: Payer: Self-pay | Admitting: Osteopathic Medicine

## 2017-04-26 ENCOUNTER — Other Ambulatory Visit: Payer: Self-pay | Admitting: Osteopathic Medicine

## 2017-05-03 ENCOUNTER — Other Ambulatory Visit: Payer: Self-pay | Admitting: Osteopathic Medicine

## 2017-05-10 ENCOUNTER — Other Ambulatory Visit: Payer: Self-pay | Admitting: Osteopathic Medicine

## 2017-05-25 ENCOUNTER — Ambulatory Visit: Payer: Medicare Other | Admitting: Osteopathic Medicine

## 2017-06-03 ENCOUNTER — Ambulatory Visit (INDEPENDENT_AMBULATORY_CARE_PROVIDER_SITE_OTHER): Payer: Medicare Other | Admitting: Osteopathic Medicine

## 2017-06-03 ENCOUNTER — Encounter: Payer: Self-pay | Admitting: Osteopathic Medicine

## 2017-06-03 VITALS — BP 110/65 | HR 99 | Wt 130.0 lb

## 2017-06-03 DIAGNOSIS — E119 Type 2 diabetes mellitus without complications: Secondary | ICD-10-CM | POA: Diagnosis not present

## 2017-06-03 DIAGNOSIS — G8929 Other chronic pain: Secondary | ICD-10-CM

## 2017-06-03 DIAGNOSIS — M1710 Unilateral primary osteoarthritis, unspecified knee: Secondary | ICD-10-CM | POA: Diagnosis not present

## 2017-06-03 DIAGNOSIS — Z1331 Encounter for screening for depression: Secondary | ICD-10-CM | POA: Diagnosis not present

## 2017-06-03 DIAGNOSIS — R413 Other amnesia: Secondary | ICD-10-CM

## 2017-06-03 DIAGNOSIS — E039 Hypothyroidism, unspecified: Secondary | ICD-10-CM

## 2017-06-03 DIAGNOSIS — M25562 Pain in left knee: Secondary | ICD-10-CM

## 2017-06-03 LAB — CBC
HCT: 36.5 % (ref 35.0–45.0)
HEMOGLOBIN: 11.9 g/dL (ref 11.7–15.5)
MCH: 28.7 pg (ref 27.0–33.0)
MCHC: 32.6 g/dL (ref 32.0–36.0)
MCV: 88.2 fL (ref 80.0–100.0)
MPV: 10.9 fL (ref 7.5–12.5)
Platelets: 234 10*3/uL (ref 140–400)
RBC: 4.14 10*6/uL (ref 3.80–5.10)
RDW: 13.6 % (ref 11.0–15.0)
WBC: 9.3 10*3/uL (ref 3.8–10.8)

## 2017-06-03 LAB — COMPLETE METABOLIC PANEL WITH GFR
AG RATIO: 1.7 (calc) (ref 1.0–2.5)
ALBUMIN MSPROF: 4.3 g/dL (ref 3.6–5.1)
ALT: 41 U/L — ABNORMAL HIGH (ref 6–29)
AST: 25 U/L (ref 10–35)
Alkaline phosphatase (APISO): 53 U/L (ref 33–130)
BUN / CREAT RATIO: 16 (calc) (ref 6–22)
BUN: 16 mg/dL (ref 7–25)
CO2: 27 mmol/L (ref 20–32)
Calcium: 9.3 mg/dL (ref 8.6–10.4)
Chloride: 100 mmol/L (ref 98–110)
Creat: 1.02 mg/dL — ABNORMAL HIGH (ref 0.60–0.88)
GFR, EST AFRICAN AMERICAN: 60 mL/min/{1.73_m2} (ref >=60)
GFR, EST NON AFRICAN AMERICAN: 52 mL/min/{1.73_m2} — AB (ref >=60)
GLOBULIN: 2.5 g/dL (ref 1.9–3.7)
Glucose, Bld: 133 mg/dL — ABNORMAL HIGH (ref 65–99)
POTASSIUM: 4.4 mmol/L (ref 3.5–5.3)
SODIUM: 139 mmol/L (ref 135–146)
TOTAL PROTEIN: 6.8 g/dL (ref 6.1–8.1)
Total Bilirubin: 0.5 mg/dL (ref 0.2–1.2)

## 2017-06-03 LAB — POCT GLYCOSYLATED HEMOGLOBIN (HGB A1C): Hemoglobin A1C: 8.1

## 2017-06-03 LAB — TSH: TSH: 24.1 mIU/L — ABNORMAL HIGH (ref 0.40–4.50)

## 2017-06-03 MED ORDER — DICLOFENAC SODIUM 1 % TD GEL: 2.0000 g | Freq: Four times a day (QID) | TRANSDERMAL | 11 refills | Status: AC

## 2017-06-03 NOTE — Progress Notes (Signed)
HPI: Tasha Sheppard is a 82 y.o. female  who presents to [facility] today, 06/03/17,  for chief complaint of:  Chief Complaint  Patient presents with  . Follow-up    diabetes   DM2: 15 units Lantus per day, taking metformin, last A1C 7.3% 6 months ago, 7.1% 3 months ago, low appetite but no hypoglycemia symptoms. Husband has concerns about weight loss over the past year. A1C 8.1% today on Metformin 100 mg bid and Lantus 15 units per day.   Weight loss: slow decline in functional status and poorly managed thyroid (persistent low TSH despite multiple reductions in synthroid dose), low appetite - no abdominal pain or coughing, no night sweats or fever. She should be only on 50 mcg per day. She cannot tell me if she is taking this properly, husband is not sure what dose she is on. In the past, there had been accidentally doubling up on thyroid medicines, no pelvic vitals with them today  Memory loss: no change, forgetful and somewhat flat affect but no mood issues. Today patient is pleasant, talkative  Musculoskeletal: Patient is following with orthopedics. Complains of persistent knee pain, would like to know if there is anything that they can put on topically that may help.  Hypothyroid: See above  Positive depression screening: Patient reports some difficulty motivating herself to be very active or to do a lot. Doesn't really want to talk about it.  Patient is accompanied by husband who assists with history-taking.   Past medical history, surgical history, social history and family history reviewed.  Patient Active Problem List   Diagnosis Date Noted  . Anemia 10/15/2016  . Displaced intertrochanteric fracture of left femur, init (HCC) 08/28/2016  . Primary osteoarthritis of left knee 08/25/2016  . Dementia without behavioral disturbance 05/27/2016  . Acute cystitis without hematuria 12/24/2015  . Knee pain, bilateral 11/22/2015  . Degeneration of  cervical intervertebral disc 08/09/2015  . Elevated lipase 07/31/2015  . Type 2 diabetes mellitus (HCC) 07/17/2015  . Osteoporosis 07/17/2015  . Hypothyroidism 07/17/2015  . Essential hypertension, benign 07/17/2015  . Memory loss 07/17/2015  . 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 Arthritis, senescent 07/17/2015    Current medication list and allergy/intolerance information reviewed.   Current Outpatient Prescriptions on File Prior to Visit  Medication Sig Dispense Refill  . alendronate (FOSAMAX) 70 MG tablet Take 1 tablet (70 mg total) by mouth once a week. Take with a full glass of water on an empty stomach. 12 tablet 11  . aspirin EC 325 MG tablet Take 1 tablet (325 mg total) by mouth daily. 30 tablet 1  . diclofenac (VOLTAREN) 25 MG EC tablet Take 1 tablet (25 mg total) by mouth 3 (three) times daily. take with small snack. 90 tablet 0  . glucose blood (IGLUCOSE TEST STRIPS) test strip Use as instructed up to qid with glucometer. Dx: E11.9 100 each 99  . HYDROcodone-acetaminophen (NORCO/VICODIN) 5-325 MG tablet Take 1 tablet by mouth every 4 (four) hours as needed for moderate pain or severe pain. 30 tablet 0  . Insulin Glargine (LANTUS SOLOSTAR) 100 UNIT/ML Solostar Pen Inject 15 Units into the skin daily. 5 pen PRN  . levothyroxine (SYNTHROID, LEVOTHROID) 50 MCG tablet TAKE 1 TABLET BY MOUTH ONCE DAILY. RECHECK LABS 60 tablet 0  . levothyroxine (SYNTHROID, LEVOTHROID) 50 MCG tablet TAKE 1 TABLET BY MOUTH ONCE DAILY. RECHECK LABS 60 tablet 0  . memantine (NAMENDA) 10 MG tablet TAKE 1 TABLET BY  MOUTH TWICE DAILY 60 tablet 2  . metFORMIN (GLUCOPHAGE) 1000 MG tablet TAKE ONE TABLET BY MOUTH TWICE DAILY FOR  BLOOD  SUGAR  CONTROL 180 tablet 2  . rosuvastatin (CRESTOR) 20 MG tablet TAKE 1 TABLET BY MOUTH ONCE DAILY 90 tablet 1   No current facility-administered medications on file prior to visit.    Allergies  Allergen Reactions  . Aricept [Donepezil Hcl]     Possibly causing  worsened nausea  . Influenza Vaccines     "bad reaction"  . Pioglitazone     confusion  . Trazodone And Nefazodone     Confusion, anxiety      Review of Systems:  Constitutional: No recent illness  Cardiac: No  chest pain  Respiratory:  No  shortness of breath.   Gastrointestinal: No  abdominal pain, no change on bowel habits   Musculoskeletal: No new myalgia/arthralgia  Neurologic: No  weakness, No  Dizziness   Exam:  BP 110/65   Pulse 99   Wt 130 lb (59 kg)   BMI 19.48 kg/m   Constitutional: VS see above. General Appearance: alert, well-developed, well-nourished, NAD  Eyes: Normal lids and conjunctive, non-icteric sclera  Ears, Nose, Mouth, Throat: MMM, Normal external inspection ears/nares/mouth/lips/gums.  Neck: No masses, trachea midline.   Respiratory: Normal respiratory effort. no wheeze, no rhonchi, no rales  Cardiovascular: S1/S2 normal, no murmur, no rub/gallop auscultated. RRR.   Musculoskeletal: Gait normal. Symmetric and independent movement of all extremities  Neurological: Normal balance/coordination. No tremor.  Skin: warm, dry, intact.   Psychiatric: Normal judgment/insight. Normal mood and affect. Oriented x3.    Recent Results (from the past 2160 hour(s))  POCT HgB A1C     Status: None   Collection Time: 06/03/17 10:56 AM  Result Value Ref Range   Hemoglobin A1C 8.1       ASSESSMENT/PLAN:   Diabetes mellitus without complication (HCC) - A1c at goal for age - Plan: POCT HgB A1C, CBC, TSH, COMPLETE METABOLIC PANEL WITH GFR  Arthritis of knee - Trial Volataren gel - Plan: diclofenac sodium (VOLTAREN) 1 % GEL  Hypothyroidism, unspecified type - Need to confirm proper dose of Synthroid!!! Has been is asked to confirm this once he goes home and looks up about all of  Chronic pain of left knee - Follow with orthopedic  Memory loss - No dramatic change. Patient is pleasant and active/talkative today. Dog is with her in the office, she  seems to get a good deal of happiness from the animal  Positive depression screening - We'll follow closely, patient reluctant to take anymore medications are discussed this too much today. Consider discussion without husband in the room next visi    Patient Instructions  Continue diabetes medicines Gel sent for knee Recheck thyroid today     Follow-up plan: Return in about 3 months (around 09/03/2017) for diabetes checkup.  Visit summary with medication list and pertinent instructions was printed for patient to review, alert us if any changes needed. All questions at time of visit were answered - patient instructed to contact office with any additional concerns. ER/RTC precautions were reviewed with the patient and understanding verbalized.

## 2017-06-03 NOTE — Patient Instructions (Signed)
Continue diabetes medicines Gel sent for knee Recheck thyroid today

## 2017-06-08 ENCOUNTER — Telehealth: Payer: Self-pay | Admitting: Osteopathic Medicine

## 2017-06-08 DIAGNOSIS — E039 Hypothyroidism, unspecified: Secondary | ICD-10-CM

## 2017-06-08 NOTE — Telephone Encounter (Signed)
Dr.Alexander please see note below from Mr. Mcomber. So according to him patient is taking the thyroid medication and she is taking it at the dosage that you last changed. Quanell Loughney,CMA \

## 2017-06-08 NOTE — Telephone Encounter (Signed)
Ok - can he tell me exactly what dosage? She probably has 3 or 4 different bottles around the house at this point, and in the past she was accidentally doubling the dose

## 2017-06-08 NOTE — Telephone Encounter (Signed)
Mr. Pershing ProudMattox called. He gave me his wife's medications over the phone (preferred to do this over the phone rather than taking the long drive over) from the med list the only thing she's not taking is the diclofenac sodium, hydrocodone, glucose blood test strips.  Thank you.

## 2017-06-08 NOTE — Telephone Encounter (Signed)
For clarification, I am to remove: diclofenac, hydrocodone, and glucose test strips?

## 2017-06-09 NOTE — Telephone Encounter (Signed)
Called and got a busy signal, will call back later. Rhonda Cunningham,CMA

## 2017-06-10 MED ORDER — LEVOTHYROXINE SODIUM 25 MCG PO TABS: 25.0000 ug | ORAL_TABLET | Freq: Every day | ORAL | 0 refills | Status: DC

## 2017-06-10 NOTE — Addendum Note (Signed)
Addended by: Deirdre PippinsALEXANDER, Altair Stanko M on: 06/10/2017 06:18 PM   Modules accepted: Orders

## 2017-06-10 NOTE — Telephone Encounter (Signed)
Patient spouse called back and stated that patient is taking 50 mcg Levothyroxine. Syann Cupples,CMA

## 2017-06-10 NOTE — Telephone Encounter (Addendum)
Okay. In that case, I have sent a lower dose into the pharmacy. We'll plan to recheck labs again in 6-8 weeks. If it's still not looking okay, I'm going to send her to a specialist. Please call and let them know they can go down to the lab in 2 months - whenever the bottle of the new medicine is running low

## 2017-06-11 MED ORDER — LEVOTHYROXINE SODIUM 75 MCG PO TABS: ORAL_TABLET | ORAL | 0 refills | Status: DC

## 2017-06-11 MED ORDER — LEVOTHYROXINE SODIUM 50 MCG PO TABS: ORAL_TABLET | ORAL | 0 refills | Status: DC

## 2017-06-11 NOTE — Addendum Note (Signed)
Addended by: Deirdre PippinsALEXANDER, Jodiann Ognibene M on: 06/11/2017 05:10 PM   Modules accepted: Orders

## 2017-06-11 NOTE — Telephone Encounter (Signed)
lft message on patient home home for spouse to call us back regarding this. Hayle Parisi,CMA

## 2017-06-11 NOTE — Telephone Encounter (Signed)
TSH low on 75, high on 50.  Husband manages meds - he feels ok to alternate 75/50 dosing Discussed over the phone w/ him personally all questions answered Also sent GoodRx coupon for diclofenac gel

## 2017-06-11 NOTE — Telephone Encounter (Signed)
Called and spoke to patient spouse and he has some concerns about his wife Levothyroxine, he stated that if it looks low like she isnt taking it should the next Rx be a higher dose instead of lower dose. Patient wants a call back from provider for better clarity. Rhonda Cunningham,CMA

## 2017-06-22 ENCOUNTER — Ambulatory Visit: Payer: Medicare Other | Admitting: Sports Medicine

## 2017-07-20 ENCOUNTER — Telehealth: Payer: Self-pay | Admitting: Osteopathic Medicine

## 2017-07-21 NOTE — Telephone Encounter (Signed)
She should be alternative every other day 50 and 75

## 2017-07-22 NOTE — Telephone Encounter (Signed)
OK 

## 2017-07-27 ENCOUNTER — Other Ambulatory Visit: Payer: Self-pay | Admitting: Osteopathic Medicine

## 2017-08-03 ENCOUNTER — Telehealth: Payer: Self-pay | Admitting: Osteopathic Medicine

## 2017-08-03 NOTE — Telephone Encounter (Signed)
Left VM for Pt's daughter with update.  

## 2017-08-03 NOTE — Telephone Encounter (Signed)
Pt's daughter called requesting a refill on Pt's thyroid medications. Advised she needs to go to the lab, she will take her today. Once results are available, we can send refill.   She also requested a faxed copy of her moms medications. Will send to fax: [phone].  She also reports her mom is having a hard time sleeping. Pt's husband is currently in the hospital, so the daughter thinks this is due to stress. Will route to PCP for review on if an appt would be required.

## 2017-08-03 NOTE — Telephone Encounter (Signed)
Await thyroid test results - pt and husband have been made aware that if not in normal range she will need to see a specialist since her levels have been very difficult to control.   For sleep, I don't think I'd prescribe a sedative since this is a situational issue. If she has any concerns, would need appt    (Clinical note re: thyroid if asked - Tasha Sheppard's levels have been all over the place, I don't have 100% confidence that she is taking medications as directed, was doubling dose of these in the past for instance, but endocrinology would be able to discuss further if I am missing something else which may be causing her levels to be erratic despite medications)

## 2017-09-03 ENCOUNTER — Encounter: Payer: Self-pay | Admitting: Osteopathic Medicine

## 2017-09-03 ENCOUNTER — Ambulatory Visit (INDEPENDENT_AMBULATORY_CARE_PROVIDER_SITE_OTHER): Payer: Medicare Other | Admitting: Osteopathic Medicine

## 2017-09-03 VITALS — BP 138/75 | HR 107 | Temp 98.3°F | Wt 135.1 lb

## 2017-09-03 DIAGNOSIS — G4701 Insomnia due to medical condition: Secondary | ICD-10-CM

## 2017-09-03 DIAGNOSIS — E1100 Type 2 diabetes mellitus with hyperosmolarity without nonketotic hyperglycemic-hyperosmolar coma (NKHHC): Secondary | ICD-10-CM | POA: Diagnosis not present

## 2017-09-03 DIAGNOSIS — R413 Other amnesia: Secondary | ICD-10-CM

## 2017-09-03 DIAGNOSIS — E039 Hypothyroidism, unspecified: Secondary | ICD-10-CM

## 2017-09-03 LAB — POCT GLYCOSYLATED HEMOGLOBIN (HGB A1C): HEMOGLOBIN A1C: 8.3

## 2017-09-03 MED ORDER — DOXEPIN HCL 3 MG PO TABS: 3.0000 mg | ORAL_TABLET | Freq: Every day | ORAL | 3 refills | Status: AC

## 2017-09-03 NOTE — Patient Instructions (Signed)
For sleep: Over the counter Melatonin

## 2017-09-03 NOTE — Progress Notes (Signed)
HPI: Tasha Sheppard is a 83 y.o. female  who presents to [facility] today, 09/03/17,  for chief complaint of:  Chief Complaint  Patient presents with  . Diabetes   DM2: A1c has been fairly stable, she is pretty consistently taking Lantus 15 units along with her metformin, medications reviewed as below.  Insomnia: New problem. Has been states that she has more recently been wandering around, waking up in the middle of the night. He would like something to "knock her out so she stays asleep." He worries about her injuring herself. She does have a history of dementia. She has tried trazodone in the past and had some problems with this medication causing some anxiety. He says he is giving her something else over-the-counter as well, he cannot recall the name of the medication  Memory loss: no change, forgetful and somewhat flat affect but no mood issues - insomnia is a new problem as above. Today patient is pleasant, talkative  Hypothyroid: We have really been struggling with variability and TSH levels, I suspect that there is a possibility of some inappropriate medication dosing, this has happened in the past when she was essentially taking 2 different doses of levothyroxine simultaneously. Patient and her husband had been advised that if TSH is not in normal range at this point on current regimen, we'll need to send to endocrinologist to rule out possible absorption problem  Patient is accompanied by husband who assists with history-taking.      Past medical history, surgical history, social history and family history reviewed.  Patient Active Problem List   Diagnosis Date Noted  . Anemia 10/15/2016  . Displaced intertrochanteric fracture of left femur, init (HCC) 08/28/2016  . Primary osteoarthritis of left knee 08/25/2016  . Dementia without behavioral disturbance 05/27/2016  . Acute cystitis without hematuria 12/24/2015  . Knee pain, bilateral  11/22/2015  . Degeneration of cervical intervertebral disc 08/09/2015  . Elevated lipase 07/31/2015  . Type 2 diabetes mellitus (HCC) 07/17/2015  . Osteoporosis 07/17/2015  . Hypothyroidism 07/17/2015  . Essential hypertension, benign 07/17/2015  . Memory loss 07/17/2015  . 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 Arthritis, senescent 07/17/2015    Current medication list and allergy/intolerance information reviewed.   Current Outpatient Medications on File Prior to Visit  Medication Sig Dispense Refill  . alendronate (FOSAMAX) 70 MG tablet Take 1 tablet (70 mg total) by mouth once a week. Take with a full glass of water on an empty stomach. 12 tablet 11  . aspirin EC 325 MG tablet Take 1 tablet (325 mg total) by mouth daily. 30 tablet 1  . diclofenac sodium (VOLTAREN) 1 % GEL Apply 2-4 g topically 4 (four) times daily. To affected joint as needed for pain. 100 g 11  . glucose blood (IGLUCOSE TEST STRIPS) test strip Use as instructed up to qid with glucometer. Dx: E11.9 100 each 99  . HYDROcodone-acetaminophen (NORCO/VICODIN) 5-325 MG tablet Take 1 tablet by mouth every 4 (four) hours as needed for moderate pain or severe pain. 30 tablet 0  . Insulin Glargine (LANTUS SOLOSTAR) 100 UNIT/ML Solostar Pen Inject 15 Units into the skin daily. 5 pen PRN  . levothyroxine (SYNTHROID, LEVOTHROID) 50 MCG tablet 50 mg po every other day, alternating with 75 mcg pills 30 tablet 0  . levothyroxine (SYNTHROID, LEVOTHROID) 75 MCG tablet TAKE 1 TABLET BY MOUTH EVERY OTHER DAY, ALTERNATING WITH TABLETS 30 tablet 0  . memantine (NAMENDA) 10 MG tablet TAKE  1 TABLET BY MOUTH TWICE DAILY 180 tablet 0  . metFORMIN (GLUCOPHAGE) 1000 MG tablet TAKE ONE TABLET BY MOUTH TWICE DAILY FOR  BLOOD  SUGAR  CONTROL 180 tablet 2  . rosuvastatin (CRESTOR) 20 MG tablet TAKE 1 TABLET BY MOUTH ONCE DAILY 90 tablet 1   No current facility-administered medications on file prior to visit.    Allergies  Allergen  Reactions  . Aricept [Donepezil Hcl]     Possibly causing worsened nausea  . Influenza Vaccines     "bad reaction"  . Pioglitazone     confusion  . Trazodone And Nefazodone     Confusion, anxiety      Review of Systems:  Constitutional: No recent illness  Cardiac: No  chest pain  Respiratory:  No  shortness of breath.   Gastrointestinal: No  abdominal pain, no change on bowel habits   Musculoskeletal: No new myalgia/arthralgia  Neurologic: No  weakness, No  Dizziness   Exam:  BP 138/75   Pulse (!) 107   Temp 98.3 F (36.8 C) (Oral)   Wt 135 lb 1.6 oz (61.3 kg)   BMI 20.24 kg/m   Constitutional: VS see above. General Appearance: alert, well-developed, well-nourished, NAD  Eyes: Normal lids and conjunctive, non-icteric sclera  Ears, Nose, Mouth, Throat: MMM, Normal external inspection ears/nares/mouth/lips/gums.  Neck: No masses, trachea midline.   Respiratory: Normal respiratory effort. no wheeze, no rhonchi, no rales  Cardiovascular: S1/S2 normal, no murmur, no rub/gallop auscultated. RRR.   Musculoskeletal: Gait normal. Symmetric and independent movement of all extremities  Neurological: Normal balance/coordination. No tremor.  Skin: warm, dry, intact.   Psychiatric: Normal judgment/insight. Normal mood and affect. Oriented x3.    Recent Results (from the past 2160 hour(s))  POCT HgB A1C     Status: None   Collection Time: 09/03/17 11:52 AM  Result Value Ref Range   Hemoglobin A1C 8.3       ASSESSMENT/PLAN:   Type 2 diabetes mellitus with hyperosmolarity without coma, without long-term current use of insulin (HCC) - Plan: POCT HgB A1C  Hypothyroidism, unspecified type - Plan: Thyroid Panel With TSH  Memory loss  Insomnia due to medical condition - Plan: Doxepin HCl 3 MG TABS    Patient Instructions  For sleep: Over the counter Melatonin      Follow-up plan: Return in about 3 months (around 12/02/2017) for recheck diabetes, sooner if  needed (let meknow how sleep medicine is working) .  Visit summary with medication list and pertinent instructions was printed for patient to review, alert us if any changes needed. All questions at time of visit were answered - patient instructed to contact office with any additional concerns. ER/RTC precautions were reviewed with the patient and understanding verbalized.

## 2017-09-05 LAB — THYROID PANEL WITH TSH
Free Thyroxine Index: 3.2 (ref 1.4–3.8)
T3 UPTAKE: 30 % (ref 22–35)
T4 TOTAL: 10.6 ug/dL (ref 5.1–11.9)
TSH: 17.64 mIU/L — ABNORMAL HIGH (ref 0.40–4.50)

## 2017-09-07 ENCOUNTER — Other Ambulatory Visit: Payer: Self-pay | Admitting: Osteopathic Medicine

## 2017-09-07 DIAGNOSIS — E039 Hypothyroidism, unspecified: Secondary | ICD-10-CM

## 2017-09-07 NOTE — Progress Notes (Signed)
Refer endocrine

## 2017-09-22 ENCOUNTER — Other Ambulatory Visit: Payer: Self-pay | Admitting: Osteopathic Medicine

## 2017-09-24 MED ORDER — GENERIC EXTERNAL MEDICATION
Status: DC
Start: ? — End: 2017-09-24

## 2017-09-24 MED ORDER — SODIUM CHLORIDE 0.9 % IV SOLN
INTRAVENOUS | Status: DC
Start: ? — End: 2017-09-24

## 2017-09-25 ENCOUNTER — Telehealth: Payer: Self-pay

## 2017-09-25 NOTE — Telephone Encounter (Signed)
[facility] pharmacy only has mylan brand available for levothyroxine 50 mcg. Is it ok to switch? Pls advise if appropriate, thanks.

## 2017-09-25 NOTE — Telephone Encounter (Signed)
That's fine

## 2017-09-25 NOTE — Telephone Encounter (Signed)
[facility] [phone]. Confirmation rec'd.

## 2017-09-29 MED ORDER — SODIUM CHLORIDE 0.9 % IV SOLN
INTRAVENOUS | Status: DC
Start: ? — End: 2017-09-29

## 2017-09-29 MED ORDER — ASPIRIN EC 81 MG PO TBEC
81.00 | DELAYED_RELEASE_TABLET | ORAL | Status: DC
Start: 2017-09-30 — End: 2017-09-29

## 2017-09-29 MED ORDER — INSULIN GLARGINE 100 UNIT/ML ~~LOC~~ SOLN
SUBCUTANEOUS | Status: DC
Start: 2017-09-29 — End: 2017-09-29

## 2017-09-29 MED ORDER — METOCLOPRAMIDE HCL 10 MG PO TABS
10.00 | ORAL_TABLET | ORAL | Status: DC
Start: 2017-09-29 — End: 2017-09-29

## 2017-09-29 MED ORDER — GENERIC EXTERNAL MEDICATION
Status: DC
Start: ? — End: 2017-09-29

## 2017-09-29 MED ORDER — OXYCODONE HCL 5 MG PO TABS
2.50 | ORAL_TABLET | ORAL | Status: DC
Start: ? — End: 2017-09-29

## 2017-09-29 MED ORDER — INSULIN LISPRO 100 UNIT/ML ~~LOC~~ SOLN
SUBCUTANEOUS | Status: DC
Start: ? — End: 2017-09-29

## 2017-09-29 MED ORDER — LEVOTHYROXINE SODIUM 25 MCG PO TABS
ORAL_TABLET | ORAL | Status: DC
Start: 2017-09-29 — End: 2017-09-29

## 2017-09-29 MED ORDER — MIRTAZAPINE 15 MG PO TABS
15.00 | ORAL_TABLET | ORAL | Status: DC
Start: 2017-09-29 — End: 2017-09-29

## 2017-09-29 MED ORDER — CELECOXIB 100 MG PO CAPS
200.00 | ORAL_CAPSULE | ORAL | Status: DC
Start: 2017-09-29 — End: 2017-09-29

## 2017-09-29 MED ORDER — ATORVASTATIN CALCIUM 40 MG PO TABS
80.00 | ORAL_TABLET | ORAL | Status: DC
Start: 2017-09-29 — End: 2017-09-29

## 2017-09-29 MED ORDER — MEMANTINE HCL 5 MG PO TABS
10.00 | ORAL_TABLET | ORAL | Status: DC
Start: 2017-09-29 — End: 2017-09-29

## 2017-09-29 MED ORDER — METFORMIN HCL 500 MG PO TABS
1000.00 | ORAL_TABLET | ORAL | Status: DC
Start: 2017-09-29 — End: 2017-09-29

## 2017-09-29 MED ORDER — FERROUS SULFATE 325 (65 FE) MG PO TABS
325.00 | ORAL_TABLET | ORAL | Status: DC
Start: 2017-09-30 — End: 2017-09-29

## 2017-10-09 ENCOUNTER — Ambulatory Visit: Payer: Medicare Other | Admitting: Endocrinology

## 2017-10-20 ENCOUNTER — Encounter: Payer: Self-pay | Admitting: Osteopathic Medicine

## 2017-10-20 DIAGNOSIS — Z9289 Personal history of other medical treatment: Secondary | ICD-10-CM | POA: Insufficient documentation

## 2017-11-13 ENCOUNTER — Other Ambulatory Visit: Payer: Self-pay | Admitting: Osteopathic Medicine

## 2017-12-02 ENCOUNTER — Ambulatory Visit (INDEPENDENT_AMBULATORY_CARE_PROVIDER_SITE_OTHER): Payer: Medicare Other | Admitting: Endocrinology

## 2017-12-02 ENCOUNTER — Encounter: Payer: Self-pay | Admitting: Endocrinology

## 2017-12-02 VITALS — BP 122/70 | HR 115 | Wt 122.0 lb

## 2017-12-02 DIAGNOSIS — E039 Hypothyroidism, unspecified: Secondary | ICD-10-CM | POA: Diagnosis not present

## 2017-12-02 NOTE — Progress Notes (Signed)
Subjective:    Patient ID: Tasha Sheppard, female    DOB: Aug 20, 1934, 83 y.o.   MRN: [id number]  HPI Pt is referred by DR Lyn Hollingshead, for hypothyroidism.  Pt had thyroidect in 2009 (in [place], [place]; she says pathol was benign).  she has been on prescribed thyroid hormone therapy since then.  No recent thyroid imaging.  she has never taken kelp or any other type of non-prescribed thyroid product.   she has never had XRT to the neck.  she has never been on amiodarone or lithium.  Synthroid dosage has varied from 25 to 112 mcg/d.  TFT have varied widely, even with little or no change in dosage.  Pt says she takes synthroid as rx'ed.  History is from husband, due to pt's dementia. He says he observes her take it each day.  He keeps meds in a locked case, to prevent pt from accidentally taking.  She never misses it.   Past Medical History:  Diagnosis Date  . Diabetes mellitus without complication (HCC)   . Hyperlipidemia   . Hypertension   . Thyroid disease     History reviewed. No pertinent surgical history.  Social History   Socioeconomic History  . Marital status: Married    Spouse name: Not on file  . Number of children: Not on file  . Years of education: Not on file  . Highest education level: Not on file  Occupational History  . Not on file  Social Needs  . Financial resource strain: Not on file  . Food insecurity:    Worry: Not on file    Inability: Not on file  . Transportation needs:    Medical: Not on file    Non-medical: Not on file  Tobacco Use  . Smoking status: Never Smoker  . Smokeless tobacco: Never Used  Substance and Sexual Activity  . Alcohol use: No  . Drug use: No  . Sexual activity: Not on file  Lifestyle  . Physical activity:    Days per week: Not on file    Minutes per session: Not on file  . Stress: Not on file  Relationships  . Social connections:    Talks on phone: Not on file    Gets together: Not on file    Attends religious service: Not on file     Active member of club or organization: Not on file    Attends meetings of clubs or organizations: Not on file    Relationship status: Not on file  . Intimate partner violence:    Fear of current or ex partner: Not on file    Emotionally abused: Not on file    Physically abused: Not on file    Forced sexual activity: Not on file  Other Topics Concern  . Not on file  Social History Narrative  . Not on file    Current Outpatient Medications on File Prior to Visit  Medication Sig Dispense Refill  . alendronate (FOSAMAX) 70 MG tablet Take 1 tablet (70 mg total) by mouth once a week. Take with a full glass of water on an empty stomach. 12 tablet 11  . aspirin EC 325 MG tablet Take 1 tablet (325 mg total) by mouth daily. 30 tablet 1  . diclofenac sodium (VOLTAREN) 1 % GEL Apply 2-4 g topically 4 (four) times daily. To affected joint as needed for pain. 100 g 11  . glucose blood (IGLUCOSE TEST STRIPS) test strip Use as instructed up to qid with  glucometer. Dx: E11.9 100 each 99  . HYDROcodone-acetaminophen (NORCO/VICODIN) 5-325 MG tablet Take 1 tablet by mouth every 4 (four) hours as needed for moderate pain or severe pain. 30 tablet 0  . Insulin Glargine (LANTUS SOLOSTAR) 100 UNIT/ML Solostar Pen Inject 15 Units into the skin daily. 5 pen PRN  . memantine (NAMENDA) 10 MG tablet TAKE 1 TABLET BY MOUTH TWICE DAILY 180 tablet 0  . metFORMIN (GLUCOPHAGE) 1000 MG tablet TAKE ONE TABLET BY MOUTH TWICE DAILY FOR  BLOOD  SUGAR  CONTROL 180 tablet 2  . rosuvastatin (CRESTOR) 20 MG tablet TAKE 1 TABLET BY MOUTH ONCE DAILY 90 tablet 1  . Doxepin HCl 3 MG TABS Take 1 tablet (3 mg total) by mouth at bedtime. (Patient not taking: Reported on 12/02/2017) 30 tablet 3   No current facility-administered medications on file prior to visit.     Allergies  Allergen Reactions  . Aricept [Donepezil Hcl]     Possibly causing worsened nausea  . Influenza Vaccines     "bad reaction"  . Pioglitazone      confusion  . Trazodone And Nefazodone     Confusion, anxiety    Family History  Problem Relation Age of Onset  . Diabetes Mother   . Stroke Mother   . Diabetes Brother   . Stroke Brother   . Thyroid disease Neg Hx    BP 122/70 (BP Location: Left Arm, Patient Position: Sitting, Cuff Size: Normal)   Pulse (!) 115   Wt 122 lb (55.3 kg)   SpO2 95%   BMI 18.28 kg/m   Review of Systems denies depression, hair loss, muscle cramps, sob, constipation, numbness, diplopia, cold intolerance, n/v, myalgias, dry skin, rhinorrhea, easy bruising, and syncope.  She has lost weight.  She has chronic memory loss.    Objective:   Physical Exam VS: see vs page GEN: no distress HEAD: head: no deformity eyes: no periorbital swelling, no proptosis external nose and ears are normal mouth: no lesion seen NECK: a healed scar is present.  I do not appreciate a nodule in the thyroid or elsewhere in the neck CHEST WALL: no deformity LUNGS: clear to auscultation CV: reg rate and rhythm, no murmur ABD: abdomen is soft, nontender.  no hepatosplenomegaly.  not distended.  no hernia MUSCULOSKELETAL: muscle bulk and strength are grossly normal.  no obvious joint swelling.  gait is slow but steady EXTEMITIES: no deformity.  no edema PULSES: no carotid bruit NEURO:  cn 2-12 grossly intact.   readily moves all 4's.  sensation is intact to touch on all 4's SKIN:  Normal texture and temperature.  No rash or suspicious lesion is visible.   NODES:  None palpable at the neck PSYCH: alert, oriented to self only.  Does not appear anxious nor depressed.    Lab Results  Component Value Date   TSH 37.44 (H) 12/02/2017   T4TOTAL 10.6 09/03/2017      Assessment & Plan:  Postsurgical hypothyroidism: worse.  I have sent a prescription to your pharmacy, to increase rx.  Recheck 30d.  We discussed how to take synthroid.   Memory loss: could be contributed to by hypothyroidism.  We'll follow

## 2017-12-02 NOTE — Patient Instructions (Addendum)
blood tests are requested for you today.  We'll let you know about the results. Based on the results, we'll change to the same pill every day, then recheck the blood tests in 1 month, which you can have drawn at the [place] office.           Levothyroxine tablets What is this medicine? LEVOTHYROXINE (lee voe thye ROX een) is a thyroid hormone. This medicine can improve symptoms of thyroid deficiency such as slow speech, lack of energy, weight gain, hair loss, dry skin, and feeling cold. It also helps to treat goiter (an enlarged thyroid gland). It is also used to treat some kinds of thyroid cancer along with surgery and other medicines. This medicine may be used for other purposes; ask your health care provider or pharmacist if you have questions. COMMON BRAND NAME(S): Estre, Levo-T, Levothroid, Levoxyl, Synthroid, Thyro-Tabs, Unithroid What should I tell my health care provider before I take this medicine? They need to know if you have any of these conditions: -angina -blood clotting problems -diabetes -dieting or on a weight loss program -fertility problems -heart disease -high levels of thyroid hormone -pituitary gland problem -previous heart attack -an unusual or allergic reaction to levothyroxine, thyroid hormones, other medicines, foods, dyes, or preservatives -pregnant or trying to get pregnant -breast-feeding How should I use this medicine? Take this medicine by mouth with plenty of water. It is best to take on an empty stomach, at least 30 minutes before or 2 hours after food. Follow the directions on the prescription label. Take at the same time each day. Do not take your medicine more often than directed. Contact your pediatrician regarding the use of this medicine in children. While this drug may be prescribed for children and infants as young as a few days of age for selected conditions, precautions do apply. For infants, you may crush the tablet and place in a small  amount of (5-10 ml or 1 to 2 teaspoonfuls) of water, breast milk, or non-soy based infant formula. Do not mix with soy-based infant formula. Give as directed. Overdosage: If you think you have taken too much of this medicine contact a poison control center or emergency room at once. NOTE: This medicine is only for you. Do not share this medicine with others. What if I miss a dose? If you miss a dose, take it as soon as you can. If it is almost time for your next dose, take only that dose. Do not take double or extra doses. What may interact with this medicine? -amiodarone -antacids -anti-thyroid medicines -calcium supplements -carbamazepine -cholestyramine -colestipol -digoxin -female hormones, including contraceptive or birth control pills -iron supplements -ketamine -liquid nutrition products like Ensure -medicines for colds and breathing difficulties -medicines for diabetes -medicines for mental depression -medicines or herbals used to decrease weight or appetite -phenobarbital or other barbiturate medications -phenytoin -prednisone or other corticosteroids -rifabutin -rifampin -soy isoflavones -sucralfate -theophylline -warfarin This list may not describe all possible interactions. Give your health care provider a list of all the medicines, herbs, non-prescription drugs, or dietary supplements you use. Also tell them if you smoke, drink alcohol, or use illegal drugs. Some items may interact with your medicine. What should I watch for while using this medicine? Be sure to take this medicine with plenty of fluids. Some tablets may cause choking, gagging, or difficulty swallowing from the tablet getting stuck in your throat. Most of these problems disappear if the medicine is taken with the right amount of water or  other fluids. Do not switch brands of this medicine unless your health care professional agrees with the change. Ask questions if you are uncertain. You will need  regular exams and occasional blood tests to check the response to treatment. If you are receiving this medicine for an underactive thyroid, it may be several weeks before you notice an improvement. Check with your doctor or health care professional if your symptoms do not improve. It may be necessary for you to take this medicine for the rest of your life. Do not stop using this medicine unless your doctor or health care professional advises you to. This medicine can affect blood sugar levels. If you have diabetes, check your blood sugar as directed. You may lose some of your hair when you first start treatment. With time, this usually corrects itself. If you are going to have surgery, tell your doctor or health care professional that you are taking this medicine. What side effects may I notice from receiving this medicine? Side effects that you should report to your doctor or health care professional as soon as possible: -allergic reactions like skin rash, itching or hives, swelling of the face, lips, or tongue -chest pain -excessive sweating or intolerance to heat -fast or irregular heartbeat -nervousness -skin rash or hives -swelling of ankles, feet, or legs -tremors Side effects that usually do not require medical attention (report to your doctor or health care professional if they continue or are bothersome): -changes in appetite -changes in menstrual periods -diarrhea -hair loss -headache -trouble sleeping -weight loss This list may not describe all possible side effects. Call your doctor for medical advice about side effects. You may report side effects to FDA at 1-800-FDA-1088. Where should I keep my medicine? Keep out of the reach of children. Store at room temperature between 15 and 30 degrees C (59 and 86 degrees F). Protect from light and moisture. Keep container tightly closed. Throw away any unused medicine after the expiration date. NOTE: This sheet is a summary. It may not  cover all possible information. If you have questions about this medicine, talk to your doctor, pharmacist, or health care provider.  2018 Elsevier/Gold Standard (2008-11-10 14:28:07)

## 2017-12-03 LAB — TSH: TSH: 37.44 u[IU]/mL — AB (ref 0.35–4.50)

## 2017-12-03 LAB — T4, FREE: Free T4: 0.87 ng/dL (ref 0.60–1.60)

## 2017-12-03 MED ORDER — LEVOTHYROXINE SODIUM 100 MCG PO TABS: 100.0000 ug | ORAL_TABLET | Freq: Every day | ORAL | 2 refills | Status: DC

## 2017-12-05 ENCOUNTER — Other Ambulatory Visit: Payer: Self-pay | Admitting: Osteopathic Medicine

## 2017-12-05 DIAGNOSIS — M810 Age-related osteoporosis without current pathological fracture: Secondary | ICD-10-CM

## 2017-12-07 ENCOUNTER — Other Ambulatory Visit: Payer: Self-pay

## 2017-12-07 ENCOUNTER — Telehealth: Payer: Self-pay

## 2017-12-07 DIAGNOSIS — M810 Age-related osteoporosis without current pathological fracture: Secondary | ICD-10-CM

## 2017-12-07 MED ORDER — ALENDRONATE SODIUM 70 MG PO TABS: 70.0000 mg | ORAL_TABLET | ORAL | 4 refills | Status: DC

## 2017-12-07 MED ORDER — ROSUVASTATIN CALCIUM 20 MG PO TABS: 20.0000 mg | ORAL_TABLET | Freq: Every day | ORAL | 1 refills | Status: DC

## 2017-12-07 NOTE — Telephone Encounter (Signed)
RX sent to pharmacy  

## 2017-12-07 NOTE — Telephone Encounter (Signed)
[facility] pharmacy requesting refill for fosamax med. Pls advise if refill appropriate.

## 2017-12-07 NOTE — Telephone Encounter (Signed)
Sure! 90 days 3 refills ok

## 2017-12-20 ENCOUNTER — Other Ambulatory Visit: Payer: Self-pay | Admitting: Osteopathic Medicine

## 2017-12-20 DIAGNOSIS — E119 Type 2 diabetes mellitus without complications: Secondary | ICD-10-CM

## 2018-01-01 ENCOUNTER — Ambulatory Visit: Payer: Medicare Other | Admitting: Endocrinology

## 2018-02-02 ENCOUNTER — Telehealth: Payer: Self-pay | Admitting: Osteopathic Medicine

## 2018-02-02 DIAGNOSIS — E039 Hypothyroidism, unspecified: Secondary | ICD-10-CM

## 2018-02-03 ENCOUNTER — Other Ambulatory Visit: Payer: Self-pay | Admitting: Endocrinology

## 2018-02-03 LAB — TSH: TSH: 0.09 m[IU]/L — AB (ref 0.40–4.50)

## 2018-02-03 MED ORDER — LEVOTHYROXINE SODIUM 88 MCG PO TABS: 88.0000 ug | ORAL_TABLET | Freq: Every day | ORAL | 1 refills | Status: AC

## 2018-02-03 NOTE — Telephone Encounter (Signed)
Pt requests labs done here for thyroid.

## 2018-02-20 ENCOUNTER — Other Ambulatory Visit: Payer: Self-pay | Admitting: Osteopathic Medicine

## 2018-02-26 ENCOUNTER — Other Ambulatory Visit: Payer: Self-pay | Admitting: Endocrinology

## 2018-03-19 ENCOUNTER — Other Ambulatory Visit: Payer: Self-pay | Admitting: Osteopathic Medicine

## 2018-04-02 ENCOUNTER — Other Ambulatory Visit: Payer: Self-pay | Admitting: Osteopathic Medicine

## 2018-04-10 ENCOUNTER — Other Ambulatory Visit: Payer: Self-pay | Admitting: Osteopathic Medicine

## 2018-04-14 ENCOUNTER — Ambulatory Visit (INDEPENDENT_AMBULATORY_CARE_PROVIDER_SITE_OTHER): Payer: Medicare Other | Admitting: Osteopathic Medicine

## 2018-04-14 ENCOUNTER — Encounter: Payer: Self-pay | Admitting: Osteopathic Medicine

## 2018-04-14 VITALS — BP 121/76 | HR 96 | Temp 98.5°F | Wt 130.8 lb

## 2018-04-14 DIAGNOSIS — E039 Hypothyroidism, unspecified: Secondary | ICD-10-CM | POA: Diagnosis not present

## 2018-04-14 DIAGNOSIS — E1100 Type 2 diabetes mellitus with hyperosmolarity without nonketotic hyperglycemic-hyperosmolar coma (NKHHC): Secondary | ICD-10-CM

## 2018-04-14 DIAGNOSIS — M810 Age-related osteoporosis without current pathological fracture: Secondary | ICD-10-CM | POA: Diagnosis not present

## 2018-04-14 DIAGNOSIS — R413 Other amnesia: Secondary | ICD-10-CM | POA: Diagnosis not present

## 2018-04-14 DIAGNOSIS — R634 Abnormal weight loss: Secondary | ICD-10-CM

## 2018-04-14 LAB — POCT GLYCOSYLATED HEMOGLOBIN (HGB A1C): HEMOGLOBIN A1C: 8.2 % — AB (ref 4.0–5.6)

## 2018-04-14 NOTE — Progress Notes (Signed)
HPI: Tasha Sheppard is a 83 y.o. female who  has a past medical history of Diabetes mellitus without complication (HCC), Hyperlipidemia, Hypertension, and Thyroid disease.  she presents to [facility] today, 04/14/18,  for chief complaint of:  Follow-up diabetes, thyroid, dementia, medication refills  Has been sometime since the patient has been seen here, though if seen her has been on multiple occasions.  She was following with endocrinology in [facility] for a while due to poorly controlled thyroid, she is overdue for follow-up with Dr. Everardo All.  A1c today indicates decent control, husband is managing insulin and other medications.  He reports she is getting 15 units of insulin daily, in addition to metformin.  Patient reports no hypoglycemic episodes, her husband confirms this.  Needs refill of Namenda.  Husband has a lot of concerns about her memory, not getting any better.  See previous notes for further discussion on this issue.  Husband has some concern about weight loss.  Patient has a very poor appetite, has been usually eats most of her leftovers.  She reports no fever/chills, no dramatic weight loss over the past couple of months.  She is sedentary.  Overall, patient states she is feeling well today and has no complaints    Past medical history, surgical history, and family history reviewed.  Current medication list and allergy/intolerance information reviewed.   (See remainder of HPI, ROS, Phys Exam below)   No results found for this or any previous visit (from the past 72 hour(s)). Wt Readings from Last 3 Encounters:  12/02/17 122 lb (55.3 kg)  09/03/17 135 lb 1.6 oz (61.3 kg)  06/03/17 130 lb (59 kg)   BP Readings from Last 3 Encounters:  12/02/17 122/70  09/03/17 138/75  06/03/17 110/65      ASSESSMENT/PLAN:   Type 2 diabetes mellitus with hyperosmolarity without coma, without long-term current use of insulin (HCC) - Stable, A1c  okay for age and comorbidities - Plan: POCT HgB A1C  Hypothyroidism, unspecified type - Needs follow-up with Dr. Everardo All, as directed  Osteoporosis, unspecified osteoporosis type, unspecified pathological fracture presence - Medications refilled  Memory loss - No dramatic change, patient is in good spirits despite memory loss/dementia.  Refilled Namenda, discussion with husband that we cannot reverse memory loss  Weight loss - Sedentary patient, poor appetite, no concerning red flag symptoms.  Likely loss of muscle mass.  Could pursue imaging, but I think thyroid needs to be addressed    Follow-up plan: Return in about 3 months (around 07/15/2018) for Recheck A1c, see me sooner if needed.          ############################################ ############################################ ############################################ ############################################    Outpatient Encounter Medications as of 04/14/2018  Medication Sig  . alendronate (FOSAMAX) 70 MG tablet Take 1 tablet (70 mg total) by mouth once a week. Take with a full glass of water on an empty stomach.  Marland Kitchen aspirin EC 325 MG tablet Take 1 tablet (325 mg total) by mouth daily.  . diclofenac sodium (VOLTAREN) 1 % GEL Apply 2-4 g topically 4 (four) times daily. To affected joint as needed for pain.  . Doxepin HCl 3 MG TABS Take 1 tablet (3 mg total) by mouth at bedtime. (Patient not taking: Reported on 12/02/2017)  . glucose blood (IGLUCOSE TEST STRIPS) test strip Use as instructed up to qid with glucometer. Dx: E11.9  . HYDROcodone-acetaminophen (NORCO/VICODIN) 5-325 MG tablet Take 1 tablet by mouth every 4 (four) hours as needed for moderate pain  or severe pain.  . Insulin Glargine (LANTUS SOLOSTAR) 100 UNIT/ML Solostar Pen Inject 15 Units into the skin daily.  Marland Kitchen. levothyroxine (SYNTHROID, LEVOTHROID) 100 MCG tablet TAKE 1 TABLET BY MOUTH ONCE DAILY BEFORE BREAKFAST  . levothyroxine (SYNTHROID, LEVOTHROID)  88 MCG tablet Take 1 tablet (88 mcg total) by mouth daily before breakfast.  . memantine (NAMENDA) 10 MG tablet Take 1 tablet (10 mg total) by mouth daily. Pt must keep appt on 04/14/18.  Marland Kitchen. metFORMIN (GLUCOPHAGE) 1000 MG tablet TAKE 1 TAB BY MOUTH TWICE DAILY FOR BLOOD SUGAR CONTROL  . rosuvastatin (CRESTOR) 20 MG tablet Take 1 tablet (20 mg total) by mouth daily.   No facility-administered encounter medications on file as of 04/14/2018.    Allergies  Allergen Reactions  . Aricept [Donepezil Hcl]     Possibly causing worsened nausea  . Influenza Vaccines     "bad reaction"  . Pioglitazone     confusion  . Trazodone And Nefazodone     Confusion, anxiety      Review of Systems:  Constitutional: No recent illness, no fever or chills, weight loss as noted per HPI  HEENT: No  headache, no vision change  Cardiac: No  chest pain, No  pressure, No palpitations  Respiratory:  No  shortness of breath. No  Cough  Gastrointestinal: No  abdominal pain, no change on bowel habits  Musculoskeletal: No new myalgia/arthralgia  Skin: No  Rash  Neurologic: No  weakness, No  Dizziness   Exam:  BP 121/76 (BP Location: Left Arm, Patient Position: Sitting, Cuff Size: Normal)   Pulse 96   Temp 98.5 F (36.9 C) (Oral)   Wt 130 lb 12.8 oz (59.3 kg)   BMI 19.60 kg/m   Constitutional: VS see above. General Appearance: alert, well-developed, well-nourished, NAD  Eyes: Normal lids and conjunctive, non-icteric sclera  Ears, Nose, Mouth, Throat: MMM, Normal external inspection ears/nares/mouth/lips/gums.  Neck: No masses, trachea midline.   Respiratory: Normal respiratory effort. no wheeze, no rhonchi, no rales  Cardiovascular: S1/S2 normal, no murmur, no rub/gallop auscultated. RRR.   Musculoskeletal: Gait normal. Symmetric and independent movement of all extremities  Neurological: Normal balance/coordination. No tremor.  Skin: warm, dry, intact.   Psychiatric: Normal  judgment/insight. Normal mood and affect. Oriented x3.   Visit summary with medication list and pertinent instructions was printed for patient to review, advised to alert us if any changes needed. All questions at time of visit were answered - patient instructed to contact office with any additional concerns. ER/RTC precautions were reviewed with the patient and understanding verbalized.   Follow-up plan: Return in about 3 months (around 07/15/2018) for Recheck A1c, see me sooner if needed.  Note: Total time spent 25 minutes, greater than 50% of the visit was spent face-to-face counseling and coordinating care for the following: The primary encounter diagnosis was Type 2 diabetes mellitus with hyperosmolarity without coma, without long-term current use of insulin (HCC). Diagnoses of Hypothyroidism, unspecified type, Osteoporosis, unspecified osteoporosis type, unspecified pathological fracture presence, Memory loss, and Weight loss were also pertinent to this visit.Marland Kitchen.  Please note: voice recognition software was used to produce this document, and typos may escape review. Please contact Dr. Lyn HollingsheadAlexander for any needed clarifications.

## 2018-04-16 ENCOUNTER — Other Ambulatory Visit: Payer: Self-pay | Admitting: Osteopathic Medicine

## 2018-04-16 DIAGNOSIS — E119 Type 2 diabetes mellitus without complications: Secondary | ICD-10-CM

## 2018-04-17 ENCOUNTER — Encounter: Payer: Self-pay | Admitting: Osteopathic Medicine

## 2018-04-17 MED ORDER — ROSUVASTATIN CALCIUM 20 MG PO TABS: 20.0000 mg | ORAL_TABLET | Freq: Every day | ORAL | 1 refills | Status: DC

## 2018-05-02 ENCOUNTER — Other Ambulatory Visit: Payer: Self-pay | Admitting: Osteopathic Medicine

## 2018-07-05 ENCOUNTER — Encounter: Payer: Self-pay | Admitting: Osteopathic Medicine

## 2018-07-05 ENCOUNTER — Ambulatory Visit (INDEPENDENT_AMBULATORY_CARE_PROVIDER_SITE_OTHER): Payer: Medicare Other | Admitting: Osteopathic Medicine

## 2018-07-05 VITALS — BP 112/62 | HR 120 | Temp 98.3°F | Wt 125.1 lb

## 2018-07-05 DIAGNOSIS — E1100 Type 2 diabetes mellitus with hyperosmolarity without nonketotic hyperglycemic-hyperosmolar coma (NKHHC): Secondary | ICD-10-CM

## 2018-07-05 DIAGNOSIS — Z23 Encounter for immunization: Secondary | ICD-10-CM | POA: Diagnosis not present

## 2018-07-05 DIAGNOSIS — E039 Hypothyroidism, unspecified: Secondary | ICD-10-CM

## 2018-07-05 LAB — POCT GLYCOSYLATED HEMOGLOBIN (HGB A1C): HEMOGLOBIN A1C: 8.5 % — AB (ref 4.0–5.6)

## 2018-07-05 NOTE — Progress Notes (Signed)
HPI: Hassan RowanMary Amason is a 83 y.o. female who  has a past medical history of Diabetes mellitus without complication (HCC), Hyperlipidemia, Hypertension, and Thyroid disease.  she presents to [facility] today, 07/05/18,  for chief complaint of:  DM2 follow-up  Pt reports doing well today, she has no concerns  Tachycardic on intake HR 120 On auscultation/exam, HR 100 See physical exam notes Denies palpitations or chest pain Denies SOB  Thyroid - following w/ Endocrine, Dr Everardo AllEllison. Should be taking Synthroid 88 mcg.   DM2 A1C 8.2% 3 mos ago 04/14/18 Husband manages meds: He reports she is getting 15 units of insulin daily, in addition to metformin.  Patient reports no hypoglycemic episodes, her husband confirms this.   Patient is a poor historian and her husband is not particularly informative either, not sure what dose of thyroid medication she is taking.  [place] [facility] [place] [facility] [place] [facility] [place] Everardo AllEllison since it is too far, requests referral to local endocrinologist.      At today's visit... Past medical history, surgical history, and family history reviewed and updated as needed.  Current medication list and allergy/intolerance information reviewed and updated as needed. (See remainder of HPI, ROS, Phys Exam below)      Results for orders placed or performed in visit on 07/05/18 (from the past 72 hour(s))  POCT HgB A1C     Status: Abnormal   Collection Time: 07/05/18  4:03 PM  Result Value Ref Range   Hemoglobin A1C 8.5 (A) 4.0 - 5.6 %   HbA1c POC (<> result, manual entry)     HbA1c, POC (prediabetic range)     HbA1c, POC (controlled diabetic range)            ASSESSMENT/PLAN: The primary encounter diagnosis was Type 2 diabetes mellitus with hyperosmolarity without coma, without long-term current use of insulin (HCC). Diagnoses of Hypothyroidism, unspecified type and Need for influenza vaccination were also pertinent to  this visit.   Orders Placed This Encounter  Procedures  . Flu vaccine HIGH DOSE PF (Fluzone High dose)  . Ambulatory referral to Endocrinology  . POCT HgB A1C     Okay to keep medicines as is for now, no hypoglycemia, husband is managing insulin but not checking his sugars. At reasonable goal A1c for her age and medical comorbidities as endocrinology has other suggestions which would be welcome  Follow-up plan: Return in about 3 months (around 10/05/2018) for recheck A1C, sooner if needed.                             ############################################ ############################################ ############################################ ############################################    Current Meds  Medication Sig  . alendronate (FOSAMAX) 70 MG tablet Take 1 tablet (70 mg total) by mouth once a week. Take with a full glass of water on an empty stomach.  Marland Kitchen. aspirin EC 325 MG tablet Take 1 tablet (325 mg total) by mouth daily.  . diclofenac sodium (VOLTAREN) 1 % GEL Apply 2-4 g topically 4 (four) times daily. To affected joint as needed for pain.  . Doxepin HCl 3 MG TABS Take 1 tablet (3 mg total) by mouth at bedtime.  Marland Kitchen. glucose blood (IGLUCOSE TEST STRIPS) test strip Use as instructed up to qid with glucometer. Dx: E11.9  . HYDROcodone-acetaminophen (NORCO/VICODIN) 5-325 MG tablet Take 1 tablet by mouth every 4 (four) hours as needed for moderate pain or severe pain.  .Marland Kitchen  LANTUS SOLOSTAR 100 UNIT/ML Solostar Pen INJECT 15 UNITS INTO THE SKIN DAILY  . levothyroxine (SYNTHROID, LEVOTHROID) 100 MCG tablet TAKE 1 TABLET BY MOUTH ONCE DAILY BEFORE BREAKFAST  . levothyroxine (SYNTHROID, LEVOTHROID) 88 MCG tablet Take 1 tablet (88 mcg total) by mouth daily before breakfast.  . memantine (NAMENDA) 10 MG tablet Take 1 tablet (10 mg total) by mouth daily. Pt must keep appt on 04/14/18.  Marland Kitchen metFORMIN (GLUCOPHAGE) 1000 MG tablet TAKE 1 TABLET BY MOUTH TWICE DAILY  FOR  BLOOD  SUGAR  CONTROL  . rosuvastatin (CRESTOR) 20 MG tablet Take 1 tablet (20 mg total) by mouth daily.    Allergies  Allergen Reactions  . Aricept [Donepezil Hcl]     Possibly causing worsened nausea  . Influenza Vaccines     "bad reaction"  . Pioglitazone     confusion  . Trazodone And Nefazodone     Confusion, anxiety       Review of Systems:  Constitutional: No recent illness  HEENT: No  headache, no vision change  Cardiac: No  chest pain, No  pressure, No palpitations  Respiratory:  No  shortness of breath. No  Cough  Gastrointestinal: No  abdominal pain, no change on bowel habits  Musculoskeletal: No new myalgia/arthralgia  Neurologic: No  weakness, No  Dizziness   Exam:  BP 112/62 (BP Location: Left Arm, Patient Position: Sitting, Cuff Size: Normal)   Pulse (!) 120   Temp 98.3 F (36.8 C) (Oral)   Wt 125 lb 1.6 oz (56.7 kg)   BMI 18.74 kg/m   Constitutional: VS see above. General Appearance: alert, well-developed, well-nourished, NAD  Eyes: Normal lids and conjunctive, non-icteric sclera  Ears, Nose, Mouth, Throat: MMM, Normal external inspection ears/nares/mouth/lips/gums.  Neck: No masses, trachea midline.   Respiratory: Normal respiratory effort. no wheeze, no rhonchi, no rales  Cardiovascular: S1/S2 normal, no murmur, no rub/gallop auscultated. RRR.   Musculoskeletal: Gait normal. Symmetric and independent movement of all extremities  Abdominal: non-tender, non-distended, no appreciable organomegaly, neg Murphy's, BS WNLx4  Neurological: Normal balance/coordination. No tremor.  Skin: warm, dry, intact.   Psychiatric: Normal judgment/insight. Normal mood and affect. Oriented x3.       Visit summary with medication list and pertinent instructions was printed for patient to review, patient was advised to alert [place] if any updates are needed. All questions at time of visit were answered - patient instructed to contact office with any  additional concerns. ER/RTC precautions were reviewed with the patient and understanding verbalized.   Note: Total time spent 25 minutes, greater than 50% of the visit was spent face-to-face counseling and coordinating care for the following: The primary encounter diagnosis was Type 2 diabetes mellitus with hyperosmolarity without coma, without long-term current use of insulin (HCC). Diagnoses of Hypothyroidism, unspecified type and Need for influenza vaccination were also pertinent to this visit.Marland Kitchen  Please note: voice recognition software was used to produce this document, and typos may escape review. Please contact Dr. Lyn Hollingshead for any needed clarifications.    Follow up plan: Return in about 3 months (around 10/05/2018) for recheck A1C, sooner if needed.

## 2018-07-16 ENCOUNTER — Other Ambulatory Visit: Payer: Self-pay | Admitting: Osteopathic Medicine

## 2018-07-16 DIAGNOSIS — E119 Type 2 diabetes mellitus without complications: Secondary | ICD-10-CM

## 2018-08-23 ENCOUNTER — Other Ambulatory Visit: Payer: Self-pay | Admitting: Endocrinology

## 2018-10-06 ENCOUNTER — Encounter: Payer: Self-pay | Admitting: Osteopathic Medicine

## 2018-10-06 ENCOUNTER — Ambulatory Visit (INDEPENDENT_AMBULATORY_CARE_PROVIDER_SITE_OTHER): Payer: Medicare Other | Admitting: Osteopathic Medicine

## 2018-10-06 VITALS — BP 123/65 | HR 126 | Temp 97.9°F | Wt 126.7 lb

## 2018-10-06 DIAGNOSIS — E039 Hypothyroidism, unspecified: Secondary | ICD-10-CM | POA: Diagnosis not present

## 2018-10-06 DIAGNOSIS — E1100 Type 2 diabetes mellitus with hyperosmolarity without nonketotic hyperglycemic-hyperosmolar coma (NKHHC): Secondary | ICD-10-CM | POA: Diagnosis not present

## 2018-10-06 LAB — POCT GLYCOSYLATED HEMOGLOBIN (HGB A1C): Hemoglobin A1C: 8.5 % — AB (ref 4.0–5.6)

## 2018-10-06 NOTE — Progress Notes (Signed)
HPI: Tasha Sheppard is a 84 y.o. female who  has a past medical history of Diabetes mellitus without complication (HCC), Hyperlipidemia, Hypertension, and Thyroid disease.  she presents to [facility] today, 10/07/18,  for chief complaint of:  DM2 follow-up  Pt reports doing well today, she has no concerns  Tachycardic on intake, this is chronic On auscultation/exam, HR 100 See physical exam notes Denies palpitations or chest pain Denies SOB  Thyroid - following w/ Endocrine, Dr Shawnee Knapp (closer than [place]. Taking medications with dinner. Not sure of dose.   DM2 A1C  03/2018: 8.2%   06/2018: 8.5% 09/2018: 8.5% Husband manages meds: He reports she is getting 15 units of insulin daily, in addition to metformin.  Patient reports no hypoglycemic episodes, her husband confirms this.   Patient is a poor historian and her husband is not particularly informative either. They do have a daughter who is helping with their care but is not present at visit today.       At today's visit... Past medical history, surgical history, and family history reviewed and updated as needed.  Current medication list and allergy/intolerance information reviewed and updated as needed. (See remainder of HPI, ROS, Phys Exam below)      Results for orders placed or performed in visit on 10/06/18 (from the past 72 hour(s))  POCT HgB A1C     Status: Abnormal   Collection Time: 10/06/18  3:23 PM  Result Value Ref Range   Hemoglobin A1C 8.5 (A) 4.0 - 5.6 %   HbA1c POC (<> result, manual entry)     HbA1c, POC (prediabetic range)     HbA1c, POC (controlled diabetic range)    CBC     Status: Abnormal   Collection Time: 10/06/18  3:53 PM  Result Value Ref Range   WBC 8.3 3.8 - 10.8 Thousand/uL   RBC 4.33 3.80 - 5.10 Million/uL   Hemoglobin 12.2 11.7 - 15.5 g/dL   HCT 48.0 16.5 - 53.7 %   MCV 88.7 80.0 - 100.0 fL   MCH 28.2 27.0 - 33.0 pg   MCHC 31.8 (L) 32.0 - 36.0 g/dL    RDW 48.2 70.7 - 86.7 %   Platelets 267 140 - 400 Thousand/uL   MPV 11.3 7.5 - 12.5 fL  COMPLETE METABOLIC PANEL WITH GFR     Status: Abnormal   Collection Time: 10/06/18  3:53 PM  Result Value Ref Range   Glucose, Bld 292 (H) 65 - 99 mg/dL    Comment: .            Fasting reference interval . For someone without known diabetes, a glucose value >125 mg/dL indicates that they may have diabetes and this should be confirmed with a follow-up test. .    BUN 15 7 - 25 mg/dL   Creat 5.44 (H) 9.20 - 0.88 mg/dL    Comment: For patients >42 years of age, the reference limit for Creatinine is approximately 13% higher for people identified as African-American. .    GFR, Est Non African American 50 (L) > OR = 60 mL/min/1.74m2   GFR, Est African American 58 (L) > OR = 60 mL/min/1.33m2   BUN/Creatinine Ratio 14 6 - 22 (calc)   Sodium 141 135 - 146 mmol/L   Potassium 3.9 3.5 - 5.3 mmol/L   Chloride 100 98 - 110 mmol/L   CO2 24 20 - 32 mmol/L   Calcium 9.8 8.6 - 10.4 mg/dL   Total Protein  6.8 6.1 - 8.1 g/dL   Albumin 4.3 3.6 - 5.1 g/dL   Globulin 2.5 1.9 - 3.7 g/dL (calc)   AG Ratio 1.7 1.0 - 2.5 (calc)   Total Bilirubin 0.4 0.2 - 1.2 mg/dL   Alkaline phosphatase (APISO) 57 37 - 153 U/L   AST 18 10 - 35 U/L   ALT 11 6 - 29 U/L  Lipid panel     Status: Abnormal   Collection Time: 10/06/18  3:53 PM  Result Value Ref Range   Cholesterol 122 <200 mg/dL   HDL 53 > OR = 50 mg/dL   Triglycerides 625 (H) <150 mg/dL   LDL Cholesterol (Calc) 45 mg/dL (calc)    Comment: Reference range: <100 . Desirable range <100 mg/dL for primary prevention;   <70 mg/dL for patients with CHD or diabetic patients  with > or = 2 CHD risk factors. Marland Kitchen LDL-C is now calculated using the Martin-Hopkins  calculation, which is a validated novel method providing  better accuracy than the Friedewald equation in the  estimation of LDL-C.  Horald Pollen et al. Lenox Ahr. 6389;373(42): 2061-2068   (http://education.[facility].com/faq/FAQ164)    Total CHOL/HDL Ratio 2.3 <5.0 (calc)   Non-HDL Cholesterol (Calc) 69 <876 mg/dL (calc)    Comment: For patients with diabetes plus 1 major ASCVD risk  factor, treating to a non-HDL-C goal of <100 mg/dL  (LDL-C of <81 mg/dL) is considered a therapeutic  option.   TSH     Status: None   Collection Time: 10/06/18  3:53 PM  Result Value Ref Range   TSH 0.99 0.40 - 4.50 mIU/L  T4, free     Status: None   Collection Time: 10/06/18  3:53 PM  Result Value Ref Range   Free T4 1.7 0.8 - 1.8 ng/dL             ASSESSMENT/PLAN: The primary encounter diagnosis was Type 2 diabetes mellitus with hyperosmolarity without coma, without long-term current use of insulin (HCC). A diagnosis of Hypothyroidism, unspecified type was also pertinent to this visit.   Orders Placed This Encounter  Procedures  . CBC  . COMPLETE METABOLIC PANEL WITH GFR  . Lipid panel  . Microalbumin / creatinine urine ratio  . TSH  . T4, free  . POCT HgB A1C     Okay to keep medicines as is for now, no hypoglycemia, husband is managing insulin but not checking his sugars. At reasonable goal A1c for her age and medical comorbidities. Will continue to follow with as endocrinology.   Patient Instructions   Please try giving the thyroid medicine before breakfast / when waking up.   Dr Shawnee Knapp wanted to see you again around 11/2018: please call their office to set up an appointment [phone]  Blood work today, please!         Follow-up plan: Return in about 3 months (around 01/04/2019) for diabetes follow-up .                             ############################################ ############################################ ############################################ ############################################    Current Meds  Medication Sig  . alendronate (FOSAMAX) 70 MG tablet Take 1 tablet (70 mg total) by mouth  once a week. Take with a full glass of water on an empty stomach.  Marland Kitchen aspirin EC 325 MG tablet Take 1 tablet (325 mg total) by mouth daily.  . diclofenac sodium (VOLTAREN) 1 % GEL Apply 2-4 g topically 4 (four) times daily.  To affected joint as needed for pain.  . Doxepin HCl 3 MG TABS Take 1 tablet (3 mg total) by mouth at bedtime.  Marland Kitchen. glucose blood (IGLUCOSE TEST STRIPS) test strip Use as instructed up to qid with glucometer. Dx: E11.9  . HYDROcodone-acetaminophen (NORCO/VICODIN) 5-325 MG tablet Take 1 tablet by mouth every 4 (four) hours as needed for moderate pain or severe pain.  Marland Kitchen. LANTUS SOLOSTAR 100 UNIT/ML Solostar Pen INJECT 15 UNITS INTO THE SKIN DAILY  . levothyroxine (SYNTHROID, LEVOTHROID) 100 MCG tablet TAKE 1 TABLET BY MOUTH ONCE DAILY BEFORE BREAKFAST  . levothyroxine (SYNTHROID, LEVOTHROID) 88 MCG tablet Take 1 tablet (88 mcg total) by mouth daily before breakfast.  . memantine (NAMENDA) 10 MG tablet Take 1 tablet (10 mg total) by mouth daily. Pt must keep appt on 04/14/18.  Marland Kitchen. metFORMIN (GLUCOPHAGE) 1000 MG tablet TAKE 1 TABLET BY MOUTH TWICE DAILY FOR BLOOD SUGAR  . rosuvastatin (CRESTOR) 20 MG tablet Take 1 tablet (20 mg total) by mouth daily.    Allergies  Allergen Reactions  . Aricept [Donepezil Hcl]     Possibly causing worsened nausea  . Influenza Vaccines     "bad reaction"  . Pioglitazone     confusion  . Trazodone And Nefazodone     Confusion, anxiety       Review of Systems:  Constitutional: No recent illness  HEENT: No  headache, no vision change  Cardiac: No  chest pain, No  pressure, No palpitations  Respiratory:  No  shortness of breath. No  Cough  Gastrointestinal: No  abdominal pain, no change on bowel habits  Musculoskeletal: No new myalgia/arthralgia  Neurologic: No  weakness, No  Dizziness   Exam:  BP 123/65 (BP Location: Left Arm, Patient Position: Sitting, Cuff Size: Normal)   Pulse (!) 126   Temp 97.9 F (36.6 C) (Oral)   Wt 126 lb  11.2 oz (57.5 kg)   BMI 18.98 kg/m   Constitutional: VS see above. General Appearance: alert, well-developed, well-nourished, NAD  Eyes: Normal lids and conjunctive, non-icteric sclera  Ears, Nose, Mouth, Throat: MMM, Normal external inspection ears/nares/mouth/lips/gums.  Neck: No masses, trachea midline.   Respiratory: Normal respiratory effort. no wheeze, no rhonchi, no rales  Cardiovascular: S1/S2 normal, no murmur, no rub/gallop auscultated. Reg rhythm, slight tachycardia.   Musculoskeletal: Gait normal. Symmetric and independent movement of all extremities  Neurological: Normal balance/coordination. No tremor.  Skin: warm, dry, intact.   Psychiatric: Normal judgment/insight. Normal mood and affect. Oriented x3.       Visit summary with medication list and pertinent instructions was printed for patient to review, patient was advised to alert us if any updates are needed. All questions at time of visit were answered - patient instructed to contact office with any additional concerns. ER/RTC precautions were reviewed with the patient and understanding verbalized.   Note: Total time spent 25 minutes, greater than 50% of the visit was spent face-to-face counseling and coordinating care for the following: The primary encounter diagnosis was Type 2 diabetes mellitus with hyperosmolarity without coma, without long-term current use of insulin (HCC). A diagnosis of Hypothyroidism, unspecified type was also pertinent to this visit.Marland Kitchen.  Please note: voice recognition software was used to produce this document, and typos may escape review. Please contact Dr. Lyn HollingsheadAlexander for any needed clarifications.    Follow up plan: Return in about 3 months (around 01/04/2019) for diabetes follow-up .

## 2018-10-06 NOTE — Patient Instructions (Addendum)
   Please try giving the thyroid medicine before breakfast / when waking up.   Dr Shawnee Knapp wanted to see you again around 11/2018: please call their office to set up an appointment [phone]  Blood work today, please!

## 2018-10-07 LAB — COMPLETE METABOLIC PANEL WITH GFR
AG Ratio: 1.7 (calc) (ref 1.0–2.5)
ALT: 11 U/L (ref 6–29)
AST: 18 U/L (ref 10–35)
Albumin: 4.3 g/dL (ref 3.6–5.1)
Alkaline phosphatase (APISO): 57 U/L (ref 37–153)
BUN/Creatinine Ratio: 14 (calc) (ref 6–22)
BUN: 15 mg/dL (ref 7–25)
CALCIUM: 9.8 mg/dL (ref 8.6–10.4)
CO2: 24 mmol/L (ref 20–32)
Chloride: 100 mmol/L (ref 98–110)
Creat: 1.05 mg/dL — ABNORMAL HIGH (ref 0.60–0.88)
GFR, EST AFRICAN AMERICAN: 58 mL/min/{1.73_m2} — AB (ref >=60)
GFR, EST NON AFRICAN AMERICAN: 50 mL/min/{1.73_m2} — AB (ref >=60)
GLUCOSE: 292 mg/dL — AB (ref 65–99)
Globulin: 2.5 g/dL (calc) (ref 1.9–3.7)
Potassium: 3.9 mmol/L (ref 3.5–5.3)
Sodium: 141 mmol/L (ref 135–146)
TOTAL PROTEIN: 6.8 g/dL (ref 6.1–8.1)
Total Bilirubin: 0.4 mg/dL (ref 0.2–1.2)

## 2018-10-07 LAB — CBC
HCT: 38.4 % (ref 35.0–45.0)
Hemoglobin: 12.2 g/dL (ref 11.7–15.5)
MCH: 28.2 pg (ref 27.0–33.0)
MCHC: 31.8 g/dL — ABNORMAL LOW (ref 32.0–36.0)
MCV: 88.7 fL (ref 80.0–100.0)
MPV: 11.3 fL (ref 7.5–12.5)
PLATELETS: 267 10*3/uL (ref 140–400)
RBC: 4.33 10*6/uL (ref 3.80–5.10)
RDW: 13 % (ref 11.0–15.0)
WBC: 8.3 10*3/uL (ref 3.8–10.8)

## 2018-10-07 LAB — TSH: TSH: 0.99 mIU/L (ref 0.40–4.50)

## 2018-10-07 LAB — LIPID PANEL
Cholesterol: 122 mg/dL (ref <200)
HDL: 53 mg/dL (ref >=50)
LDL Cholesterol (Calc): 45 mg/dL (calc)
Non-HDL Cholesterol (Calc): 69 mg/dL (calc) (ref <130)
Total CHOL/HDL Ratio: 2.3 (calc) (ref <5.0)
Triglycerides: 163 mg/dL — ABNORMAL HIGH (ref <150)

## 2018-10-07 LAB — T4, FREE: Free T4: 1.7 ng/dL (ref 0.8–1.8)

## 2018-10-24 ENCOUNTER — Other Ambulatory Visit: Payer: Self-pay | Admitting: Osteopathic Medicine

## 2018-10-24 DIAGNOSIS — E119 Type 2 diabetes mellitus without complications: Secondary | ICD-10-CM

## 2018-12-04 ENCOUNTER — Other Ambulatory Visit: Payer: Self-pay | Admitting: Osteopathic Medicine

## 2018-12-21 ENCOUNTER — Ambulatory Visit (INDEPENDENT_AMBULATORY_CARE_PROVIDER_SITE_OTHER): Payer: Medicare Other | Admitting: Osteopathic Medicine

## 2018-12-21 VITALS — BP 110/71 | HR 99 | Temp 98.1°F | Wt 125.2 lb

## 2018-12-21 DIAGNOSIS — E1100 Type 2 diabetes mellitus with hyperosmolarity without nonketotic hyperglycemic-hyperosmolar coma (NKHHC): Secondary | ICD-10-CM

## 2018-12-21 DIAGNOSIS — I10 Essential (primary) hypertension: Secondary | ICD-10-CM

## 2018-12-21 DIAGNOSIS — E039 Hypothyroidism, unspecified: Secondary | ICD-10-CM

## 2018-12-21 DIAGNOSIS — N179 Acute kidney failure, unspecified: Secondary | ICD-10-CM

## 2018-12-21 DIAGNOSIS — Z111 Encounter for screening for respiratory tuberculosis: Secondary | ICD-10-CM | POA: Diagnosis not present

## 2018-12-21 DIAGNOSIS — Z23 Encounter for immunization: Secondary | ICD-10-CM

## 2018-12-21 MED ORDER — TETANUS-DIPHTH-ACELL PERTUSSIS 5-2-15.5 LF-MCG/0.5 IM SUSP: 0.5000 mL | Freq: Once | INTRAMUSCULAR | 0 refills | Status: AC

## 2018-12-21 NOTE — Progress Notes (Signed)
HPI: Tasha Sheppard is a 84 y.o. female who  has a past medical history of Diabetes mellitus without complication (HCC), Hyperlipidemia, Hypertension, and Thyroid disease.  she presents to [facility] today, 12/21/18,  for chief complaint of:  Evaluation for skilled nursing facility   . Context: husband recently ill and having trouble caring for her. Daughter would like to temporarily place her in skilled nursing  . Patient feeling otherwise well   Patient is accompanied by daughter and husband who assists with history-taking.    At today's visit 12/23/18 ... PMH, PSH, FH reviewed and updated as needed.  Current medication list and allergy/intolerance hx reviewed and updated as needed. (See remainder of HPI, ROS, Phys Exam below)   No results found.  Results for orders placed or performed in visit on 12/21/18 (from the past 72 hour(s))  TSH     Status: None   Collection Time: 12/22/18  2:32 PM  Result Value Ref Range   TSH 1.38 0.40 - 4.50 mIU/L  BASIC METABOLIC PANEL WITH GFR     Status: Abnormal   Collection Time: 12/22/18  2:32 PM  Result Value Ref Range   Glucose, Bld 147 (H) 65 - 99 mg/dL    Comment: .            Fasting reference interval . For someone without known diabetes, a glucose value >125 mg/dL indicates that they may have diabetes and this should be confirmed with a follow-up test. .    BUN 26 (H) 7 - 25 mg/dL   Creat 9.64 (H) 3.83 - 0.88 mg/dL    Comment: For patients >13 years of age, the reference limit for Creatinine is approximately 13% higher for people identified as African-American. .    GFR, Est Non African American 24 (L) > OR = 60 mL/min/1.31m2   GFR, Est African American 28 (L) > OR = 60 mL/min/1.80m2   BUN/Creatinine Ratio 14 6 - 22 (calc)   Sodium 142 135 - 146 mmol/L   Potassium 4.2 3.5 - 5.3 mmol/L   Chloride 102 98 - 110 mmol/L   CO2 24 20 - 32 mmol/L   Calcium 9.4 8.6 - 10.4 mg/dL           ASSESSMENT/PLAN: The primary encounter diagnosis was Screening-pulmonary TB. Diagnoses of Need for Tdap vaccination, Essential hypertension, benign, Type 2 diabetes mellitus with hyperosmolarity without coma, without long-term current use of insulin (HCC), Hypothyroidism, unspecified type, and AKI (acute kidney injury) (HCC) were also pertinent to this visit.  AKI on labs, daughter did have some concerns for decreased fluid intake. See result note, will follow up on these labs and calculate FeNa   Orders Placed This Encounter  Procedures  . QuantiFERON-TB Gold Plus  . TSH  . BASIC METABOLIC PANEL WITH GFR  . BASIC METABOLIC PANEL WITH GFR  . Urinalysis  . Creatinine, Urine  . Sodium, urine, random       Meds ordered this encounter  Medications  . Tdap (ADACEL) 12-17-13.5 LF-MCG/0.5 injection    Sig: Inject 0.5 mLs into the muscle once for 1 dose. Please fax vaccination confirmation to Dr Lyn Hollingshead [phone]    Dispense:  0.5 mL    Refill:  0    There are no Patient Instructions on file for this visit.    Follow-up plan: Return in about 3 months (around 03/23/2019) for recheck chronic conditions, see me sooner if needed .                                                 ################################################# ################################################# ################################################# #################################################  Current Meds  Medication Sig  . alendronate (FOSAMAX) 70 MG tablet Take 1 tablet (70 mg total) by mouth once a week. Take with a full glass of water on an empty stomach.  Marland Kitchen. aspirin EC 325 MG tablet Take 1 tablet (325 mg total) by mouth daily.  . diclofenac sodium (VOLTAREN) 1 % GEL Apply 2-4 g topically 4 (four) times daily. To affected joint as needed for pain.  . Doxepin HCl 3 MG TABS Take 1 tablet (3 mg total) by mouth at bedtime.  Marland Kitchen. glucose  blood (IGLUCOSE TEST STRIPS) test strip Use as instructed up to qid with glucometer. Dx: E11.9  . HYDROcodone-acetaminophen (NORCO/VICODIN) 5-325 MG tablet Take 1 tablet by mouth every 4 (four) hours as needed for moderate pain or severe pain.  Marland Kitchen. LANTUS SOLOSTAR 100 UNIT/ML Solostar Pen INJECT 15 UNITS INTO THE SKIN DAILY  . levothyroxine (SYNTHROID, LEVOTHROID) 88 MCG tablet Take 1 tablet (88 mcg total) by mouth daily before breakfast.  . memantine (NAMENDA) 10 MG tablet Take 1 tablet (10 mg total) by mouth daily. Pt must keep appt on 04/14/18.  Marland Kitchen. metFORMIN (GLUCOPHAGE) 1000 MG tablet TAKE 1 TABLET BY MOUTH TWICE DAILY FOR BLOOD SUGAR  . rosuvastatin (CRESTOR) 20 MG tablet Take 1 tablet by mouth once daily  . [DISCONTINUED] levothyroxine (SYNTHROID, LEVOTHROID) 100 MCG tablet TAKE 1 TABLET BY MOUTH ONCE DAILY BEFORE BREAKFAST    Allergies  Allergen Reactions  . Aricept [Donepezil Hcl]     Possibly causing worsened nausea  . Influenza Vaccines     "bad reaction"  . Pioglitazone     confusion  . Trazodone And Nefazodone     Confusion, anxiety       Review of Systems: limited d/t dementia   Constitutional: No recent illness  HEENT: No  headache  Cardiac: No  chest pain,   Respiratory:  No  shortness of breath. No  Cough  Gastrointestinal: No  abdominal pain  Musculoskeletal: No new myalgia/arthralgia  Skin: No  Rash  Hem/Onc: No  easy bruising/bleeding, No  abnormal lumps/bumps  Neurologic: No  weakness   Exam:  BP 110/71 (BP Location: Left Arm, Patient Position: Sitting, Cuff Size: Normal)   Pulse 99   Temp 98.1 F (36.7 C) (Oral)   Wt 125 lb 3.2 oz (56.8 kg)   BMI 18.76 kg/m   Constitutional: VS see above. General Appearance: alert, well-developed, well-nourished, NAD  Eyes: Normal lids and conjunctive, non-icteric sclera  Ears, Nose, Mouth, Throat: MMM, Normal external inspection ears/nares/mouth/lips/gums.  Neck: No masses, trachea midline.    Respiratory: Normal respiratory effort. no wheeze, no rhonchi, no rales  Cardiovascular: S1/S2 normal, no murmur, no rub/gallop auscultated. RRR.   Musculoskeletal: Gait normal. Symmetric and independent movement of all extremities  Abdominal: non-tender, non-distended, no appreciable organomegaly, neg Murphy's, BS WNLx4  Neurological: Normal balance/coordination. No tremor.  Skin: warm, dry, intact.   Psychiatric: Normal judgment/insight. Normal mood and affect. Oriented x3.    Immunization History  Administered Date(s) Administered  . Influenza, High Dose Seasonal PF 07/05/2018  . PPD Test 10/02/2016  . Pneumococcal Conjugate-13 09/16/2016       Visit summary with medication list and pertinent instructions was printed for patient to review, patient was advised to alert us if any updates are needed. All questions at time of visit were answered - patient instructed to contact office with any additional concerns. ER/RTC precautions were reviewed with the patient and understanding verbalized.   Note: Total time spent 25 minutes,  greater than 50% of the visit was spent face-to-face counseling and coordinating care for the following: The primary encounter diagnosis was Screening-pulmonary TB. Diagnoses of Need for Tdap vaccination, Essential hypertension, benign, Type 2 diabetes mellitus with hyperosmolarity without coma, without long-term current use of insulin (HCC), and Hypothyroidism, unspecified type were also pertinent to this visit.Marland Kitchen  Please note: voice recognition software was used to produce this document, and typos may escape review. Please contact Dr. Lyn Hollingshead for any needed clarifications.    Follow up plan: Return in about 3 months (around 03/23/2019) for recheck chronic conditions, see me sooner if needed .

## 2018-12-23 ENCOUNTER — Encounter: Payer: Self-pay | Admitting: Osteopathic Medicine

## 2018-12-23 LAB — BASIC METABOLIC PANEL WITH GFR
BUN/Creatinine Ratio: 14 (calc) (ref 6–22)
BUN: 26 mg/dL — ABNORMAL HIGH (ref 7–25)
CO2: 24 mmol/L (ref 20–32)
Calcium: 9.4 mg/dL (ref 8.6–10.4)
Chloride: 102 mmol/L (ref 98–110)
Creat: 1.92 mg/dL — ABNORMAL HIGH (ref 0.60–0.88)
GFR, Est African American: 28 mL/min/{1.73_m2} — ABNORMAL LOW (ref >=60)
GFR, Est Non African American: 24 mL/min/{1.73_m2} — ABNORMAL LOW (ref >=60)
Glucose, Bld: 147 mg/dL — ABNORMAL HIGH (ref 65–99)
Potassium: 4.2 mmol/L (ref 3.5–5.3)
Sodium: 142 mmol/L (ref 135–146)

## 2018-12-23 LAB — TSH: TSH: 1.38 mIU/L (ref 0.40–4.50)

## 2018-12-24 LAB — QUANTIFERON-TB GOLD PLUS
Mitogen-NIL: 8.69 IU/mL
NIL: 0.02 IU/mL
QuantiFERON-TB Gold Plus: NEGATIVE
TB1-NIL: 0 IU/mL
TB2-NIL: 0 IU/mL

## 2019-01-05 ENCOUNTER — Ambulatory Visit: Payer: Medicare Other | Admitting: Osteopathic Medicine

## 2019-03-06 ENCOUNTER — Other Ambulatory Visit: Payer: Self-pay | Admitting: Osteopathic Medicine

## 2019-03-06 DIAGNOSIS — M810 Age-related osteoporosis without current pathological fracture: Secondary | ICD-10-CM

## 2019-03-10 ENCOUNTER — Other Ambulatory Visit: Payer: Self-pay | Admitting: Osteopathic Medicine

## 2019-03-10 DIAGNOSIS — M810 Age-related osteoporosis without current pathological fracture: Secondary | ICD-10-CM

## 2019-03-14 ENCOUNTER — Other Ambulatory Visit: Payer: Self-pay | Admitting: Osteopathic Medicine

## 2019-03-15 ENCOUNTER — Other Ambulatory Visit: Payer: Self-pay | Admitting: Osteopathic Medicine

## 2019-04-10 ENCOUNTER — Other Ambulatory Visit: Payer: Self-pay | Admitting: Osteopathic Medicine

## 2019-04-11 NOTE — Telephone Encounter (Signed)
Requested medication (s) are due for refill today: yes  Requested medication (s) are on the active medication list: yes  Last refill:  04/12/2018  Future visit scheduled: no  Notes to clinic: review for refill   Requested Prescriptions  Pending Prescriptions Disp Refills   memantine (NAMENDA) 10 MG tablet [Pharmacy Med Name: Memantine HCl 10 MG Oral Tablet] 90 tablet 0    Sig: Take 1 tablet by mouth once daily     Neurology:  Alzheimer's Agents Passed - 04/10/2019  3:19 PM      Passed - Valid encounter within last 6 months    Recent Outpatient Visits          3 months ago Screening-pulmonary TB   [facility], [place], DO   6 months ago Type 2 diabetes mellitus with hyperosmolarity without coma, without long-term current use of insulin [facility])   [facility] [facility], Lanelle Bal, DO   9 months ago Type 2 diabetes mellitus with hyperosmolarity without coma, without long-term current use of insulin [facility])   [place] [facility], Lanelle Bal, DO   12 months ago Type 2 diabetes mellitus with hyperosmolarity without coma, without long-term current use of insulin [facility])   [place] [facility], Lanelle Bal, DO   1 year ago Type 2 diabetes mellitus with hyperosmolarity without coma, without long-term current use of insulin [facility])   [place] [facility], [place], DO

## 2019-05-18 ENCOUNTER — Telehealth: Payer: Self-pay

## 2019-05-18 NOTE — Telephone Encounter (Signed)
Pt's daughter called requesting to speak with provider. She wants to discuss a plan of care for placing patient in a facility. Pt's husband can no longer take care of pt. Spoke with Perrin Smack, she informed me that pt has been rambling, not making any kind of sense, not eating any food for some time. Pt is only having liquid supplements for meal intake (Glucerna). Pt's blood sugar was 600 this afternoon and was given 20 units of her insulin. Endocrinologist took patient off her metformin med due to diarrhea. I've informed Perrin Smack that if pt symptoms worsen or change to take pt to nearest ER/UC. She was agreeable with plan. Pls advise, thanks.

## 2019-05-19 NOTE — Telephone Encounter (Signed)
Forwarding to covering provider.

## 2019-05-19 NOTE — Telephone Encounter (Signed)
I am not covering Dr. Sheppard Coil today, I think it may be Dr. Madilyn Fireman.

## 2019-05-20 NOTE — Telephone Encounter (Signed)
Left a vm msg for pt's daughter to return a call back to center. Direct call back info provided.

## 2019-05-20 NOTE — Telephone Encounter (Signed)
Recommend they schedule an appointment with Dr. Sheppard Coil to go over an extensive care plan.  Can be done virtually over a phone call but needs to be placed on her schedule.

## 2019-05-23 ENCOUNTER — Telehealth: Payer: Self-pay

## 2019-05-23 NOTE — Telephone Encounter (Signed)
Pt's daughter called - wanted provider to be aware that her mother's health is declining rapidly. Pt no longer able to eat/walk or take care of her herself even with assistance from husband. Raynelle Chary was also informing me that pt has an appt today with endocrinologist. Unable to make the appt. She informed me that a hospice facility will be contacting our office sometime today. She was advised by covering provider to make a virtual visit with provider to discuss plan of care for pt. Transferred to scheduling desk for appt.

## 2019-05-23 NOTE — Telephone Encounter (Signed)
Agree needs appt to discuss in detail.

## 2019-05-23 NOTE — Telephone Encounter (Signed)
Daughter was informed to make a virtual appt with provider. She stated she will call back when she can to schedule an appt for her mother. No other inquiries during call.

## 2019-05-24 ENCOUNTER — Ambulatory Visit: Payer: Medicare Other | Admitting: Osteopathic Medicine

## 2019-05-24 ENCOUNTER — Other Ambulatory Visit: Payer: Self-pay | Admitting: Osteopathic Medicine

## 2019-05-24 ENCOUNTER — Telehealth: Payer: Self-pay

## 2019-05-24 ENCOUNTER — Encounter: Payer: Self-pay | Admitting: Osteopathic Medicine

## 2019-05-24 ENCOUNTER — Ambulatory Visit (INDEPENDENT_AMBULATORY_CARE_PROVIDER_SITE_OTHER): Payer: Medicare Other | Admitting: Osteopathic Medicine

## 2019-05-24 DIAGNOSIS — R4182 Altered mental status, unspecified: Secondary | ICD-10-CM

## 2019-05-24 DIAGNOSIS — E1100 Type 2 diabetes mellitus with hyperosmolarity without nonketotic hyperglycemic-hyperosmolar coma (NKHHC): Secondary | ICD-10-CM

## 2019-05-24 DIAGNOSIS — R634 Abnormal weight loss: Secondary | ICD-10-CM

## 2019-05-24 DIAGNOSIS — I10 Essential (primary) hypertension: Secondary | ICD-10-CM | POA: Diagnosis not present

## 2019-05-24 DIAGNOSIS — M810 Age-related osteoporosis without current pathological fracture: Secondary | ICD-10-CM

## 2019-05-24 DIAGNOSIS — F0391 Unspecified dementia with behavioral disturbance: Secondary | ICD-10-CM

## 2019-05-24 DIAGNOSIS — R413 Other amnesia: Secondary | ICD-10-CM

## 2019-05-24 DIAGNOSIS — E039 Hypothyroidism, unspecified: Secondary | ICD-10-CM

## 2019-05-24 NOTE — Telephone Encounter (Signed)
RN Katrina from [facility] called regarding pt. As per nurse - pt's daughter declined to have labs completed or to take pt to ER for evaluation. As of today, pt has been admitted into hospice care. Nurse stated that provider may contact her at [phone], however she is attending to other patient and may not be readily available. If provider has any recommendations, pls call the direct line for hospice at [phone].

## 2019-05-24 NOTE — Addendum Note (Signed)
Addended by: Maryla Morrow on: 05/24/2019 09:45 AM   Modules accepted: Orders

## 2019-05-24 NOTE — Progress Notes (Signed)
Attempted to contact pt's daughter Raynelle Chary at 60 am, no answer. Left a vm msg.

## 2019-05-24 NOTE — Progress Notes (Signed)
Virtual Visit via Phone  I connected with      Sherica Paternostro daughter Perrin Smack on 05/24/19 at 9:10 AM by a telemedicine application and verified that I am speaking with the correct person using two identifiers.  Patient is at home I am in office   I discussed the limitations of evaluation and management by telemedicine and the availability of in person appointments. The patient expressed understanding and agreed to proceed.  History of Present Illness: Tasha Sheppard is a 84 y.o. female who would like to discuss mom's medical condition decline   Patient's daughter has had some concerns about decline in functional status.  Patient has known history of dementia, type 2 diabetes, hypothyroidism.  Over the past month or 2, gradual worsening to the point where the patient cannot ambulate on her own, is becoming combative and spitting out all of her medications, is not eating or drinking, is soiling herself unable to control bladder or bowels.    Patient's husband is also relatively frail and unable to care for her, daughter Joellen Jersey is intermittently available, she herself has a broken arm at this point so is not much help physically.  Raquel Sarna is requesting an assessment from hospice.     Observations/Objective: There were no vitals taken for this visit. BP Readings from Last 3 Encounters:  12/21/18 110/71  10/06/18 123/65  07/05/18 112/62     Lab and Radiology Results No results found for this or any previous visit (from the past 72 hour(s)). No results found.     Assessment and Plan: 84 y.o. female with The primary encounter diagnosis was Altered mental status, unspecified altered mental status type. Diagnoses of Dementia with behavioral disturbance, unspecified dementia type ([place]), Essential hypertension, benign, Type 2 diabetes mellitus with hyperosmolarity without coma, without long-term current use of insulin ([place]), Hypothyroidism, unspecified type, Osteoporosis, unspecified  osteoporosis type, unspecified pathological fracture presence, Weight loss, and Memory loss were also pertinent to this visit.  I discussed with daughter my limited ability to fully diagnose the situation over the phone.  Certainly could be a progression of dementia to end-stage symptoms, however any situation where there is altered mental status should certainly prompt evaluation for underlying cause such as infection, metabolic abnormality, medication problem or other cause delirium.    Patient's daughter states that she is not able to get the patient in the car to bring her to the lab, she does not want to take her to the emergency department at this time.   We will see if home health/hospice can get urinalysis, urine microscopy, urine culture, CBC, TSH, CMP?  If they are unable to do so, or if the nurse performing the assessment thinks it is necessary for her to go to the emergency room, they should go to the hospital.    Would certainly not admit this patient to hospice without an evaluation for altered mental status to determine if any reversible/treatable cause of her symptoms.      Follow Up Instructions: Return for Recheck pending hospice evaluation, patient needs eval for AMS whether with me or in ER.    I discussed the assessment and treatment plan with the patient. The patient was provided an opportunity to ask questions and all were answered. The patient agreed with the plan and demonstrated an understanding of the instructions.   The patient was advised to call back or seek an in-person evaluation if any new concerns, if symptoms worsen or if the condition fails to improve as  anticipated.  40 minutes of non-face-to-face time was provided during this encounter.                      Historical information moved to improve visibility of documentation.  Past Medical History:  Diagnosis Date  . Diabetes mellitus without complication (HCC)   . Hyperlipidemia    . Hypertension   . Thyroid disease    No past surgical history on file. Social History   Tobacco Use  . Smoking status: Never Smoker  . Smokeless tobacco: Never Used  Substance Use Topics  . Alcohol use: No   family history includes Diabetes in her brother and mother; Stroke in her brother and mother.  Medications: Current Outpatient Medications  Medication Sig Dispense Refill  . alendronate (FOSAMAX) 70 MG tablet TAKE 1 TABLET BY MOUTH ONCE A WEEK WITH  A  FULL  GLASS  OF  WATER  ON  AN  EMPTY  STOMACH 12 tablet 0  . aspirin EC 325 MG tablet Take 1 tablet (325 mg total) by mouth daily. 30 tablet 1  . diclofenac sodium (VOLTAREN) 1 % GEL Apply 2-4 g topically 4 (four) times daily. To affected joint as needed for pain. 100 g 11  . Doxepin HCl 3 MG TABS Take 1 tablet (3 mg total) by mouth at bedtime. 30 tablet 3  . glucose blood (IGLUCOSE TEST STRIPS) test strip Use as instructed up to qid with glucometer. Dx: E11.9 100 each 99  . HYDROcodone-acetaminophen (NORCO/VICODIN) 5-325 MG tablet Take 1 tablet by mouth every 4 (four) hours as needed for moderate pain or severe pain. 30 tablet 0  . LANTUS SOLOSTAR 100 UNIT/ML Solostar Pen INJECT 15 UNITS INTO THE SKIN DAILY 5 pen 3  . levothyroxine (SYNTHROID, LEVOTHROID) 88 MCG tablet Take 1 tablet (88 mcg total) by mouth daily before breakfast. 90 tablet 1  . memantine (NAMENDA) 10 MG tablet Take 1 tablet by mouth once daily 90 tablet 0  . metFORMIN (GLUCOPHAGE) 1000 MG tablet TAKE 1 TABLET BY MOUTH TWICE DAILY FOR BLOOD SUGAR 180 tablet 3  . rosuvastatin (CRESTOR) 20 MG tablet Take 1 tablet by mouth once daily 90 tablet 0   No current facility-administered medications for this visit.    Allergies  Allergen Reactions  . Aricept [Donepezil Hcl]     Possibly causing worsened nausea  . Influenza Vaccines     "bad reaction"  . Pioglitazone     confusion  . Trazodone And Nefazodone     Confusion, anxiety    PDMP not reviewed this  encounter. No orders of the defined types were placed in this encounter.  No orders of the defined types were placed in this encounter.

## 2019-05-24 NOTE — Telephone Encounter (Signed)
RN Jerolyn Shin called requesting documentation / records of pt's diagnosis. Family is attempting to have pt placed in the facility today. As per Dr. Sheppard Coil, pt needs labs drawn at home visit. If not, pt should report to nearest ER for evaluation of altered mental state and labs. Family was informed of recommendation during telephone visit with provider. Attempted to return a call back to RN Jerolyn Shin - phone was busy. Unable to leave a vm msg. Most recent visit summary, list of meds and lab order faxed to [phone] as "URGENT". Confirmation was rec'd.

## 2019-06-23 ENCOUNTER — Telehealth: Payer: Self-pay

## 2019-07-19 NOTE — Telephone Encounter (Signed)
Thanks for letting me know, sorry to hear about this

## 2019-07-19 NOTE — Telephone Encounter (Signed)
RN Aram Beecham called to inform provider that pt passed away earlier this afternoon at 138 pm. She did not mention what was the cause of death. The funeral home and family has been notified. If provider has any questions, pls call [phone].

## 2019-07-19 DEATH — deceased
